# Patient Record
Sex: Female | Born: 1970 | Race: Black or African American | Hispanic: No | Marital: Single | Smoking: Never smoker
Health system: Southern US, Community
[De-identification: ages and names within clinical notes are randomized; demographics above are authoritative.]

## PROBLEM LIST (undated history)

## (undated) DIAGNOSIS — N390 Urinary tract infection, site not specified: Secondary | ICD-10-CM

## (undated) DIAGNOSIS — A048 Other specified bacterial intestinal infections: Secondary | ICD-10-CM

## (undated) DIAGNOSIS — R768 Other specified abnormal immunological findings in serum: Secondary | ICD-10-CM

## (undated) DIAGNOSIS — I469 Cardiac arrest, cause unspecified: Secondary | ICD-10-CM

## (undated) DIAGNOSIS — R7309 Other abnormal glucose: Secondary | ICD-10-CM

## (undated) DIAGNOSIS — I10 Essential (primary) hypertension: Secondary | ICD-10-CM

## (undated) HISTORY — DX: Other specified bacterial intestinal infections: A04.8

## (undated) HISTORY — PX: COLON SURGERY: SHX602

## (undated) HISTORY — PX: TUBAL LIGATION: SHX77

## (undated) HISTORY — DX: Other specified abnormal immunological findings in serum: R76.8

## (undated) HISTORY — PX: ABDOMINAL SURGERY: SHX537

## (undated) HISTORY — DX: Essential (primary) hypertension: I10

## (undated) HISTORY — PX: ESOPHAGOGASTRODUODENOSCOPY: SHX1529

## (undated) HISTORY — PX: COLPOSCOPY: SHX161

## (undated) HISTORY — PX: DILATION AND CURETTAGE OF UTERUS: SHX78

## (undated) HISTORY — DX: Urinary tract infection, site not specified: N39.0

## (undated) HISTORY — DX: Other abnormal glucose: R73.09

---

## 2014-04-19 ENCOUNTER — Encounter (HOSPITAL_COMMUNITY): Payer: Self-pay | Admitting: Emergency Medicine

## 2014-04-19 ENCOUNTER — Emergency Department (HOSPITAL_COMMUNITY)
Admission: EM | Admit: 2014-04-19 | Discharge: 2014-04-19 | Disposition: A | Discharge status: Home / Self Care | Payer: Medicaid - Out of State | Attending: Emergency Medicine | Admitting: Emergency Medicine

## 2014-04-19 ENCOUNTER — Emergency Department (HOSPITAL_COMMUNITY): Payer: Medicaid - Out of State

## 2014-04-19 DIAGNOSIS — Z8674 Personal history of sudden cardiac arrest: Secondary | ICD-10-CM | POA: Diagnosis not present

## 2014-04-19 DIAGNOSIS — R079 Chest pain, unspecified: Secondary | ICD-10-CM

## 2014-04-19 DIAGNOSIS — R51 Headache: Secondary | ICD-10-CM | POA: Diagnosis not present

## 2014-04-19 HISTORY — DX: Cardiac arrest, cause unspecified: I46.9

## 2014-04-19 LAB — CBC
HCT: 39.9 % (ref 36.0–46.0)
Hemoglobin: 13 g/dL (ref 12.0–15.0)
MCH: 26.9 pg (ref 26.0–34.0)
MCHC: 32.6 g/dL (ref 30.0–36.0)
MCV: 82.6 fL (ref 78.0–100.0)
Platelets: 247 10*3/uL (ref 150–400)
RBC: 4.83 MIL/uL (ref 3.87–5.11)
RDW: 14.6 % (ref 11.5–15.5)
WBC: 4.1 10*3/uL (ref 4.0–10.5)

## 2014-04-19 LAB — BASIC METABOLIC PANEL
Anion gap: 13 (ref 5–15)
BUN: 10 mg/dL (ref 6–23)
CALCIUM: 8.6 mg/dL (ref 8.4–10.5)
CO2: 23 mEq/L (ref 19–32)
Chloride: 104 mEq/L (ref 96–112)
Creatinine, Ser: 0.66 mg/dL (ref 0.50–1.10)
Glucose, Bld: 112 mg/dL — ABNORMAL HIGH (ref 70–99)
POTASSIUM: 3.7 meq/L (ref 3.7–5.3)
SODIUM: 140 meq/L (ref 137–147)

## 2014-04-19 LAB — I-STAT TROPONIN, ED: TROPONIN I, POC: 0 ng/mL (ref 0.00–0.08)

## 2014-04-19 MED ORDER — ASPIRIN 325 MG PO TABS
325.0000 mg | ORAL_TABLET | ORAL | Status: AC
  Administered 2014-04-19: 325 mg via ORAL
  Filled 2014-04-19: qty 1

## 2014-04-19 MED ORDER — IBUPROFEN 200 MG PO TABS
600.0000 mg | ORAL_TABLET | Freq: Once | ORAL | Status: AC
  Administered 2014-04-19: 600 mg via ORAL
  Filled 2014-04-19: qty 3
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
## 2014-04-19 NOTE — ED Notes (Signed)
Pt c/o L sided CP radiating at times to R chest and L temple. Pt states pain in chest "feels like a brain freeze from a slushy in my chest" +nausea at times.

## 2014-04-19 NOTE — Discharge Instructions (Signed)
Take motrin or aleve as need for pain. Follow up with primary care doctor, and cardiologist, in the next 1-2 weeks - see referral. Return to ER if worse, new symptoms, trouble breathing, severe headache, persistent/recurrent chest pain, fevers, other concern.     Chest Pain (Nonspecific) It is often hard to give a specific diagnosis for the cause of chest pain. There is always a chance that your pain could be related to something serious, such as a heart attack or a blood clot in the lungs. You need to follow up with your health care provider for further evaluation. CAUSES   Heartburn.  Pneumonia or bronchitis.  Anxiety or stress.  Inflammation around your heart (pericarditis) or lung (pleuritis or pleurisy).  A blood clot in the lung.  A collapsed lung (pneumothorax). It can develop suddenly on its own (spontaneous pneumothorax) or from trauma to the chest.  Shingles infection (herpes zoster virus). The chest wall is composed of bones, muscles, and cartilage. Any of these can be the source of the pain.  The bones can be bruised by injury.  The muscles or cartilage can be strained by coughing or overwork.  The cartilage can be affected by inflammation and become sore (costochondritis). DIAGNOSIS  Lab tests or other studies may be needed to find the cause of your pain. Your health care provider may have you take a test called an ambulatory electrocardiogram (ECG). An ECG records your heartbeat patterns over a 24-hour period. You may also have other tests, such as:  Transthoracic echocardiogram (TTE). During echocardiography, sound waves are used to evaluate how blood flows through your heart.  Transesophageal echocardiogram (TEE).  Cardiac monitoring. This allows your health care provider to monitor your heart rate and rhythm in real time.  Holter monitor. This is a portable device that records your heartbeat and can help diagnose heart arrhythmias. It allows your health care  provider to track your heart activity for several days, if needed.  Stress tests by exercise or by giving medicine that makes the heart beat faster. TREATMENT   Treatment depends on what may be causing your chest pain. Treatment may include:  Acid blockers for heartburn.  Anti-inflammatory medicine.  Pain medicine for inflammatory conditions.  Antibiotics if an infection is present.  You may be advised to change lifestyle habits. This includes stopping smoking and avoiding alcohol, caffeine, and chocolate.  You may be advised to keep your head raised (elevated) when sleeping. This reduces the chance of acid going backward from your stomach into your esophagus. Most of the time, nonspecific chest pain will improve within 2-3 days with rest and mild pain medicine.  HOME CARE INSTRUCTIONS   If antibiotics were prescribed, take them as directed. Finish them even if you start to feel better.  For the next few days, avoid physical activities that bring on chest pain. Continue physical activities as directed.  Do not use any tobacco products, including cigarettes, chewing tobacco, or electronic cigarettes.  Avoid drinking alcohol.  Only take medicine as directed by your health care provider.  Follow your health care provider's suggestions for further testing if your chest pain does not go away.  Keep any follow-up appointments you made. If you do not go to an appointment, you could develop lasting (chronic) problems with pain. If there is any problem keeping an appointment, call to reschedule. SEEK MEDICAL CARE IF:   Your chest pain does not go away, even after treatment.  You have a rash with blisters on your  chest.  You have a fever. SEEK IMMEDIATE MEDICAL CARE IF:   You have increased chest pain or pain that spreads to your arm, neck, jaw, back, or abdomen.  You have shortness of breath.  You have an increasing cough, or you cough up blood.  You have severe back or  abdominal pain.  You feel nauseous or vomit.  You have severe weakness.  You faint.  You have chills. This is an emergency. Do not wait to see if the pain will go away. Get medical help at once. Call your local emergency services (911 in [place] Do not drive yourself to the hospital. MAKE SURE YOU:   Understand these instructions.  Will watch your condition.  Will get help right away if you are not doing well or get worse. Document Released: 07/05/2005 Document Revised: 09/30/2013 Document Reviewed: 04/30/2008 [facility] Patient Information 2015 [place], [place]. This information is not intended to replace advice given to you by your health care provider. Make sure you discuss any questions you have with your health care provider.      Chest Wall Pain Chest wall pain is pain in or around the bones and muscles of your chest. It may take up to 6 weeks to get better. It may take longer if you must stay physically active in your work and activities.  CAUSES  Chest wall pain may happen on its own. However, it may be caused by:  A viral illness like the flu.  Injury.  Coughing.  Exercise.  Arthritis.  Fibromyalgia.  Shingles. HOME CARE INSTRUCTIONS   Avoid overtiring physical activity. Try not to strain or perform activities that cause pain. This includes any activities using your chest or your abdominal and side muscles, especially if heavy weights are used.  Put ice on the sore area.  Put ice in a plastic bag.  Place a towel between your skin and the bag.  Leave the ice on for 15-20 minutes per hour while awake for the first 2 days.  Only take over-the-counter or prescription medicines for pain, discomfort, or fever as directed by your caregiver. SEEK IMMEDIATE MEDICAL CARE IF:   Your pain increases, or you are very uncomfortable.  You have a fever.  Your chest pain becomes worse.  You have new, unexplained symptoms.  You have nausea or vomiting.  You feel  sweaty or lightheaded.  You have a cough with phlegm (sputum), or you cough up blood. MAKE SURE YOU:   Understand these instructions.  Will watch your condition.  Will get help right away if you are not doing well or get worse. Document Released: 09/25/2005 Document Revised: 12/18/2011 Document Reviewed: 05/22/2011 [facility] Patient Information 2015 [place], [place]. This information is not intended to replace advice given to you by your health care provider. Make sure you discuss any questions you have with your health care provider.    General Headache Without Cause A headache is pain or discomfort felt around the head or neck area. The specific cause of a headache may not be found. There are many causes and types of headaches. A few common ones are:  Tension headaches.  Migraine headaches.  Cluster headaches.  Chronic daily headaches. HOME CARE INSTRUCTIONS   Keep all follow-up appointments with your caregiver or any specialist referral.  Only take over-the-counter or prescription medicines for pain or discomfort as directed by your caregiver.  Lie down in a dark, quiet room when you have a headache.  Keep a headache journal to find out  what may trigger your migraine headaches. For example, write down:  What you eat and drink.  How much sleep you get.  Any change to your diet or medicines.  Try massage or other relaxation techniques.  Put ice packs or heat on the head and neck. Use these 3 to 4 times per day for 15 to 20 minutes each time, or as needed.  Limit stress.  Sit up straight, and do not tense your muscles.  Quit smoking if you smoke.  Limit alcohol use.  Decrease the amount of caffeine you drink, or stop drinking caffeine.  Eat and sleep on a regular schedule.  Get 7 to 9 hours of sleep, or as recommended by your caregiver.  Keep lights dim if bright lights bother you and make your headaches worse. SEEK MEDICAL CARE IF:   You have problems with  the medicines you were prescribed.  Your medicines are not working.  You have a change from the usual headache.  You have nausea or vomiting. SEEK IMMEDIATE MEDICAL CARE IF:   Your headache becomes severe.  You have a fever.  You have a stiff neck.  You have loss of vision.  You have muscular weakness or loss of muscle control.  You start losing your balance or have trouble walking.  You feel faint or pass out.  You have severe symptoms that are different from your first symptoms. MAKE SURE YOU:   Understand these instructions.  Will watch your condition.  Will get help right away if you are not doing well or get worse. Document Released: 09/25/2005 Document Revised: 12/18/2011 Document Reviewed: 10/11/2011 [facility] Patient Information 2015 [place], [place]. This information is not intended to replace advice given to you by your health care provider. Make sure you discuss any questions you have with your health care provider.

## 2014-10-18 ENCOUNTER — Emergency Department (INDEPENDENT_AMBULATORY_CARE_PROVIDER_SITE_OTHER)
Admission: EM | Admit: 2014-10-18 | Discharge: 2014-10-18 | Disposition: A | Discharge status: Home / Self Care | Payer: PRIVATE HEALTH INSURANCE | Source: Home / Self Care | Attending: Emergency Medicine | Admitting: Emergency Medicine

## 2014-10-18 ENCOUNTER — Encounter (HOSPITAL_COMMUNITY): Payer: Self-pay

## 2014-10-18 DIAGNOSIS — R079 Chest pain, unspecified: Secondary | ICD-10-CM | POA: Diagnosis not present

## 2014-10-18 DIAGNOSIS — J40 Bronchitis, not specified as acute or chronic: Secondary | ICD-10-CM

## 2014-10-18 MED ORDER — IBUPROFEN 800 MG PO TABS: 800.0000 mg | ORAL_TABLET | Freq: Three times a day (TID) | ORAL | Status: DC | PRN

## 2014-10-18 MED ORDER — AMOXICILLIN 500 MG PO CAPS: 500.0000 mg | ORAL_CAPSULE | Freq: Three times a day (TID) | ORAL | Status: DC

## 2014-10-18 MED ORDER — PREDNISONE 20 MG PO TABS: 40.0000 mg | ORAL_TABLET | Freq: Every day | ORAL | Status: DC
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
## 2014-10-18 NOTE — Discharge Instructions (Signed)
You have bronchitis. Take ibuprofen 800mg  3 times a day for the next week to get rid of that chest soreness. Take prednisone 2 pills daily for 5 days. Take amoxicillin 1 pill 3 times a day for 7 days.  Follow up as needed.

## 2014-10-18 NOTE — ED Notes (Signed)
Patient complains of having cough and congestion and headache that started about a week ago Patient also complains of having left side chest pain not related to cough And discomfort to her left arm

## 2015-03-10 ENCOUNTER — Encounter (HOSPITAL_COMMUNITY): Payer: Self-pay

## 2015-03-10 ENCOUNTER — Emergency Department (HOSPITAL_COMMUNITY): Payer: 59

## 2015-03-10 ENCOUNTER — Emergency Department (HOSPITAL_COMMUNITY)
Admission: EM | Admit: 2015-03-10 | Discharge: 2015-03-11 | Disposition: A | Discharge status: Home / Self Care | Payer: 59 | Attending: Emergency Medicine | Admitting: Emergency Medicine

## 2015-03-10 DIAGNOSIS — Z7952 Long term (current) use of systemic steroids: Secondary | ICD-10-CM | POA: Insufficient documentation

## 2015-03-10 DIAGNOSIS — Z8674 Personal history of sudden cardiac arrest: Secondary | ICD-10-CM | POA: Diagnosis not present

## 2015-03-10 DIAGNOSIS — Y998 Other external cause status: Secondary | ICD-10-CM | POA: Diagnosis not present

## 2015-03-10 DIAGNOSIS — Z792 Long term (current) use of antibiotics: Secondary | ICD-10-CM | POA: Diagnosis not present

## 2015-03-10 DIAGNOSIS — Y9389 Activity, other specified: Secondary | ICD-10-CM | POA: Diagnosis not present

## 2015-03-10 DIAGNOSIS — Y288XXA Contact with other sharp object, undetermined intent, initial encounter: Secondary | ICD-10-CM | POA: Diagnosis not present

## 2015-03-10 DIAGNOSIS — S31030A Puncture wound without foreign body of lower back and pelvis without penetration into retroperitoneum, initial encounter: Secondary | ICD-10-CM | POA: Insufficient documentation

## 2015-03-10 DIAGNOSIS — Y929 Unspecified place or not applicable: Secondary | ICD-10-CM | POA: Insufficient documentation

## 2015-03-10 DIAGNOSIS — T148XXA Other injury of unspecified body region, initial encounter: Secondary | ICD-10-CM

## 2015-03-10 DIAGNOSIS — Z23 Encounter for immunization: Secondary | ICD-10-CM | POA: Diagnosis not present

## 2015-03-10 MED ORDER — TETANUS-DIPHTH-ACELL PERTUSSIS 5-2.5-18.5 LF-MCG/0.5 IM SUSP
0.5000 mL | Freq: Once | INTRAMUSCULAR | Status: AC
  Administered 2015-03-10: 0.5 mL via INTRAMUSCULAR
  Filled 2015-03-10: qty 0.5

## 2015-03-10 MED ORDER — TRAMADOL HCL 50 MG PO TABS
50.0000 mg | ORAL_TABLET | Freq: Once | ORAL | Status: AC
  Administered 2015-03-10: 50 mg via ORAL
  Filled 2015-03-10: qty 1
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
## 2015-03-10 NOTE — ED Notes (Signed)
Patient transported to X-ray 
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
## 2015-03-11 MED ORDER — SULFAMETHOXAZOLE-TRIMETHOPRIM 800-160 MG PO TABS: 1.0000 | ORAL_TABLET | Freq: Two times a day (BID) | ORAL | Status: AC

## 2015-03-11 MED ORDER — TRAMADOL HCL 50 MG PO TABS: 50.0000 mg | ORAL_TABLET | Freq: Four times a day (QID) | ORAL | Status: DC | PRN

## 2015-03-11 NOTE — Discharge Instructions (Signed)
Please follow up with your primary care physician in 1-2 days. If you do not have one please call the [facility] number listed above. Please take your antibiotic until completion. Please read all discharge instructions and return precautions.   Puncture Wound A puncture wound is an injury that extends through all layers of the skin and into the tissue beneath the skin (subcutaneous tissue). Puncture wounds become infected easily because germs often enter the body and go beneath the skin during the injury. Having a deep wound with a small entrance point makes it difficult for your caregiver to adequately clean the wound. This is especially true if you have stepped on a nail and it has passed through a dirty shoe or other situations where the wound is obviously contaminated. CAUSES  Many puncture wounds involve glass, nails, splinters, fish hooks, or other objects that enter the skin (foreign bodies). A puncture wound may also be caused by a human bite or animal bite. DIAGNOSIS  A puncture wound is usually diagnosed by your history and a physical exam. You may need to have an X-ray or an ultrasound to check for any foreign bodies still in the wound. TREATMENT   Your caregiver will clean the wound as thoroughly as possible. Depending on the location of the wound, a bandage (dressing) may be applied.  Your caregiver might prescribe antibiotic medicines.  You may need a follow-up visit to check on your wound. Follow all instructions as directed by your caregiver. HOME CARE INSTRUCTIONS   Change your dressing once per day, or as directed by your caregiver. If the dressing sticks, it may be removed by soaking the area in water.  If your caregiver has given you follow-up instructions, it is very important that you return for a follow-up appointment. Not following up as directed could result in a chronic or permanent injury, pain, and disability.  Only take over-the-counter or  prescription medicines for pain, discomfort, or fever as directed by your caregiver.  If you are given antibiotics, take them as directed. Finish them even if you start to feel better. You may need a tetanus shot if:  You cannot remember when you had your last tetanus shot.  You have never had a tetanus shot. If you got a tetanus shot, your arm may swell, get red, and feel warm to the touch. This is common and not a problem. If you need a tetanus shot and you choose not to have one, there is a rare chance of getting tetanus. Sickness from tetanus can be serious. You may need a rabies shot if an animal bite caused your puncture wound. SEEK MEDICAL CARE IF:   You have redness, swelling, or increasing pain in the wound.  You have red streaks going away from the wound.  You notice a bad smell coming from the wound or dressing.  You have yellowish-white fluid (pus) coming from the wound.  You are treated with an antibiotic for infection, but the infection is not getting better.  You notice something in the wound, such as rubber from your shoe, cloth, or another object.  You have a fever.  You have severe pain.  You have difficulty breathing.  You feel dizzy or faint.  You cannot stop vomiting.  You lose feeling, develop numbness, or cannot move a limb below the wound.  Your symptoms worsen. MAKE SURE YOU:  Understand these instructions.  Will watch your condition.  Will get help right away if you are  not doing well or get worse. Document Released: 07/05/2005 Document Revised: 12/18/2011 Document Reviewed: 03/14/2011 [facility] Patient Information 2015 [place], [place]. This information is not intended to replace advice given to you by your health care provider. Make sure you discuss any questions you have with your health care provider.

## 2015-03-30 ENCOUNTER — Encounter (HOSPITAL_COMMUNITY): Payer: Self-pay | Admitting: Emergency Medicine

## 2015-03-30 DIAGNOSIS — R1013 Epigastric pain: Secondary | ICD-10-CM | POA: Diagnosis not present

## 2015-03-30 DIAGNOSIS — R112 Nausea with vomiting, unspecified: Secondary | ICD-10-CM | POA: Insufficient documentation

## 2015-03-30 DIAGNOSIS — Z79899 Other long term (current) drug therapy: Secondary | ICD-10-CM | POA: Diagnosis not present

## 2015-03-30 DIAGNOSIS — Z792 Long term (current) use of antibiotics: Secondary | ICD-10-CM | POA: Diagnosis not present

## 2015-03-30 DIAGNOSIS — Z8674 Personal history of sudden cardiac arrest: Secondary | ICD-10-CM | POA: Insufficient documentation

## 2015-03-30 DIAGNOSIS — Z3202 Encounter for pregnancy test, result negative: Secondary | ICD-10-CM | POA: Diagnosis not present

## 2015-03-30 DIAGNOSIS — R197 Diarrhea, unspecified: Secondary | ICD-10-CM | POA: Insufficient documentation

## 2015-03-30 DIAGNOSIS — R109 Unspecified abdominal pain: Secondary | ICD-10-CM | POA: Diagnosis present

## 2015-03-31 ENCOUNTER — Emergency Department (HOSPITAL_COMMUNITY)
Admission: EM | Admit: 2015-03-31 | Discharge: 2015-03-31 | Disposition: A | Discharge status: Home / Self Care | Payer: 59 | Attending: Emergency Medicine | Admitting: Emergency Medicine

## 2015-03-31 ENCOUNTER — Emergency Department (HOSPITAL_COMMUNITY): Payer: 59

## 2015-03-31 ENCOUNTER — Encounter (HOSPITAL_COMMUNITY): Payer: Self-pay | Admitting: Radiology

## 2015-03-31 DIAGNOSIS — R1013 Epigastric pain: Secondary | ICD-10-CM

## 2015-03-31 LAB — CBC WITH DIFFERENTIAL/PLATELET
BASOS PCT: 0 % (ref 0–1)
Basophils Absolute: 0 10*3/uL (ref 0.0–0.1)
EOS ABS: 0.1 10*3/uL (ref 0.0–0.7)
Eosinophils Relative: 2 % (ref 0–5)
HEMATOCRIT: 40.5 % (ref 36.0–46.0)
Hemoglobin: 13.5 g/dL (ref 12.0–15.0)
Lymphocytes Relative: 41 % (ref 12–46)
Lymphs Abs: 2.6 10*3/uL (ref 0.7–4.0)
MCH: 26.8 pg (ref 26.0–34.0)
MCHC: 33.3 g/dL (ref 30.0–36.0)
MCV: 80.4 fL (ref 78.0–100.0)
MONO ABS: 0.5 10*3/uL (ref 0.1–1.0)
Monocytes Relative: 7 % (ref 3–12)
NEUTROS ABS: 3.1 10*3/uL (ref 1.7–7.7)
Neutrophils Relative %: 50 % (ref 43–77)
Platelets: 276 10*3/uL (ref 150–400)
RBC: 5.04 MIL/uL (ref 3.87–5.11)
RDW: 14.8 % (ref 11.5–15.5)
WBC: 6.3 10*3/uL (ref 4.0–10.5)

## 2015-03-31 LAB — COMPREHENSIVE METABOLIC PANEL
ALBUMIN: 4.1 g/dL (ref 3.5–5.0)
ALT: 17 U/L (ref 14–54)
AST: 22 U/L (ref 15–41)
Alkaline Phosphatase: 70 U/L (ref 38–126)
Anion gap: 11 (ref 5–15)
BILIRUBIN TOTAL: 0.2 mg/dL — AB (ref 0.3–1.2)
BUN: 11 mg/dL (ref 6–20)
CHLORIDE: 103 mmol/L (ref 101–111)
CO2: 23 mmol/L (ref 22–32)
CREATININE: 0.78 mg/dL (ref 0.44–1.00)
Calcium: 9 mg/dL (ref 8.9–10.3)
GFR calc Af Amer: >60 mL/min (ref >60)
GFR calc non Af Amer: >60 mL/min (ref >60)
Glucose, Bld: 109 mg/dL — ABNORMAL HIGH (ref 65–99)
Potassium: 3.4 mmol/L — ABNORMAL LOW (ref 3.5–5.1)
Sodium: 137 mmol/L (ref 135–145)
Total Protein: 7.8 g/dL (ref 6.5–8.1)

## 2015-03-31 LAB — URINALYSIS, ROUTINE W REFLEX MICROSCOPIC
Bilirubin Urine: NEGATIVE
Glucose, UA: 100 mg/dL — AB
Ketones, ur: NEGATIVE mg/dL
LEUKOCYTES UA: NEGATIVE
NITRITE: NEGATIVE
Protein, ur: NEGATIVE mg/dL
Specific Gravity, Urine: 1.025 (ref 1.005–1.030)
UROBILINOGEN UA: 0.2 mg/dL (ref 0.0–1.0)
pH: 6 (ref 5.0–8.0)

## 2015-03-31 LAB — URINE MICROSCOPIC-ADD ON

## 2015-03-31 LAB — LIPASE, BLOOD: Lipase: 30 U/L (ref 22–51)

## 2015-03-31 LAB — POC URINE PREG, ED: Preg Test, Ur: NEGATIVE

## 2015-03-31 MED ORDER — DICYCLOMINE HCL 10 MG/ML IM SOLN
20.0000 mg | Freq: Once | INTRAMUSCULAR | Status: AC
  Administered 2015-03-31: 20 mg via INTRAMUSCULAR
  Filled 2015-03-31: qty 2

## 2015-03-31 MED ORDER — ONDANSETRON HCL 4 MG/2ML IJ SOLN
4.0000 mg | Freq: Once | INTRAMUSCULAR | Status: AC
  Administered 2015-03-31: 4 mg via INTRAVENOUS
  Filled 2015-03-31: qty 2

## 2015-03-31 MED ORDER — PANTOPRAZOLE SODIUM 40 MG IV SOLR
40.0000 mg | Freq: Once | INTRAVENOUS | Status: AC
  Administered 2015-03-31: 40 mg via INTRAVENOUS
  Filled 2015-03-31: qty 40

## 2015-03-31 MED ORDER — SUCRALFATE 1 G PO TABS
1.0000 g | ORAL_TABLET | Freq: Once | ORAL | Status: AC
  Administered 2015-03-31: 1 g via ORAL
  Filled 2015-03-31: qty 1

## 2015-03-31 MED ORDER — METOCLOPRAMIDE HCL 10 MG PO TABS: 10.0000 mg | ORAL_TABLET | Freq: Three times a day (TID) | ORAL | Status: DC

## 2015-03-31 MED ORDER — DICYCLOMINE HCL 20 MG PO TABS: 20.0000 mg | ORAL_TABLET | Freq: Four times a day (QID) | ORAL | Status: DC | PRN

## 2015-03-31 MED ORDER — KETOROLAC TROMETHAMINE 30 MG/ML IJ SOLN
30.0000 mg | Freq: Once | INTRAMUSCULAR | Status: AC
  Administered 2015-03-31: 30 mg via INTRAVENOUS
  Filled 2015-03-31: qty 1

## 2015-03-31 MED ORDER — IOHEXOL 300 MG/ML  SOLN
100.0000 mL | Freq: Once | INTRAMUSCULAR | Status: AC | PRN
  Administered 2015-03-31: 100 mL via INTRAVENOUS

## 2015-03-31 MED ORDER — FENTANYL CITRATE (PF) 100 MCG/2ML IJ SOLN
50.0000 ug | Freq: Once | INTRAMUSCULAR | Status: AC
  Administered 2015-03-31: 50 ug via INTRAVENOUS
  Filled 2015-03-31: qty 2

## 2015-03-31 MED ORDER — FAMOTIDINE 20 MG PO TABS: 20.0000 mg | ORAL_TABLET | Freq: Two times a day (BID) | ORAL | Status: DC

## 2015-03-31 MED ORDER — MORPHINE SULFATE 2 MG/ML IJ SOLN
2.0000 mg | Freq: Once | INTRAMUSCULAR | Status: AC
  Administered 2015-03-31: 2 mg via INTRAVENOUS
  Filled 2015-03-31: qty 1

## 2015-03-31 MED ORDER — SODIUM CHLORIDE 0.9 % IV BOLUS (SEPSIS)
1000.0000 mL | Freq: Once | INTRAVENOUS | Status: AC
  Administered 2015-03-31: 1000 mL via INTRAVENOUS

## 2015-03-31 NOTE — Discharge Instructions (Signed)
Your workup today has not shown a specific cause for your symptoms.  No signs of obstruction, inflammation or infection were noted.  Follow-up with her primary care doctor.  If symptoms continue, you may need follow-up with a gastroenterologist.  Take medications as prescribed.  Return to the emergency department for worsening condition or new concerning symptoms.   Abdominal Pain Many things can cause abdominal pain. Usually, abdominal pain is not caused by a disease and will improve without treatment. It can often be observed and treated at home. Your health care provider will do a physical exam and possibly order blood tests and X-rays to help determine the seriousness of your pain. However, in many cases, more time must pass before a clear cause of the pain can be found. Before that point, your health care provider may not know if you need more testing or further treatment. HOME CARE INSTRUCTIONS  Monitor your abdominal pain for any changes. The following actions may help to alleviate any discomfort you are experiencing:  Only take over-the-counter or prescription medicines as directed by your health care provider.  Do not take laxatives unless directed to do so by your health care provider.  Try a clear liquid diet (broth, tea, or water) as directed by your health care provider. Slowly move to a bland diet as tolerated. SEEK MEDICAL CARE IF:  You have unexplained abdominal pain.  You have abdominal pain associated with nausea or diarrhea.  You have pain when you urinate or have a bowel movement.  You experience abdominal pain that wakes you in the night.  You have abdominal pain that is worsened or improved by eating food.  You have abdominal pain that is worsened with eating fatty foods.  You have a fever. SEEK IMMEDIATE MEDICAL CARE IF:   Your pain does not go away within 2 hours.  You keep throwing up (vomiting).  Your pain is felt only in portions of the abdomen, such as  the right side or the left lower portion of the abdomen.  You pass bloody or black tarry stools. MAKE SURE YOU:  Understand these instructions.   Will watch your condition.   Will get help right away if you are not doing well or get worse.  Document Released: 07/05/2005 Document Revised: 09/30/2013 Document Reviewed: 06/04/2013 [facility] Patient Information 2015 [place], [place]. This information is not intended to replace advice given to you by your health care provider. Make sure you discuss any questions you have with your health care provider.  Pain of Unknown Etiology (Pain Without a Known Cause) You have come to your caregiver because of pain. Pain can occur in any part of the body. Often there is not a definite cause. If your laboratory (blood or urine) work was normal and X-rays or other studies were normal, your caregiver may treat you without knowing the cause of the pain. An example of this is the headache. Most headaches are diagnosed by taking a history. This means your caregiver asks you questions about your headaches. Your caregiver determines a treatment based on your answers. Usually testing done for headaches is normal. Often testing is not done unless there is no response to medications. Regardless of where your pain is located today, you can be given medications to make you comfortable. If no physical cause of pain can be found, most cases of pain will gradually leave as suddenly as they came.  If you have a painful condition and no reason can be found for the pain,  it is important that you follow up with your caregiver. If the pain becomes worse or does not go away, it may be necessary to repeat tests and look further for a possible cause.  Only take over-the-counter or prescription medicines for pain, discomfort, or fever as directed by your caregiver.  For the protection of your privacy, test results cannot be given over the phone. Make sure you receive the results of your  test. Ask how these results are to be obtained if you have not been informed. It is your responsibility to obtain your test results.  You may continue all activities unless the activities cause more pain. When the pain lessens, it is important to gradually resume normal activities. Resume activities by beginning slowly and gradually increasing the intensity and duration of the activities or exercise. During periods of severe pain, bed rest may be helpful. Lie or sit in any position that is comfortable.  Ice used for acute (sudden) conditions may be effective. Use a large plastic bag filled with ice and wrapped in a towel. This may provide pain relief.  See your caregiver for continued problems. Your caregiver can help or refer you for exercises or physical therapy if necessary. If you were given medications for your condition, do not drive, operate machinery or power tools, or sign legal documents for 24 hours. Do not drink alcohol, take sleeping pills, or take other medications that may interfere with treatment. See your caregiver immediately if you have pain that is becoming worse and not relieved by medications. Document Released: 06/20/2001 Document Revised: 07/16/2013 Document Reviewed: 09/25/2005 [facility] Patient Information 2015 [place], [place]. This information is not intended to replace advice given to you by your health care provider. Make sure you discuss any questions you have with your health care provider.

## 2015-03-31 NOTE — ED Notes (Signed)
Pt. reports generalized abdominal pain with nausea and diarrhea onset this week , denies emesis . No fever or chills.
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
## 2015-08-11 ENCOUNTER — Emergency Department (HOSPITAL_COMMUNITY)
Admission: EM | Admit: 2015-08-11 | Discharge: 2015-08-12 | Disposition: A | Discharge status: Home / Self Care | Payer: Commercial Managed Care - HMO | Attending: Emergency Medicine | Admitting: Emergency Medicine

## 2015-08-11 ENCOUNTER — Encounter (HOSPITAL_COMMUNITY): Payer: Self-pay | Admitting: Emergency Medicine

## 2015-08-11 DIAGNOSIS — Z3202 Encounter for pregnancy test, result negative: Secondary | ICD-10-CM | POA: Insufficient documentation

## 2015-08-11 DIAGNOSIS — R110 Nausea: Secondary | ICD-10-CM | POA: Insufficient documentation

## 2015-08-11 DIAGNOSIS — R1013 Epigastric pain: Secondary | ICD-10-CM | POA: Insufficient documentation

## 2015-08-11 DIAGNOSIS — R1010 Upper abdominal pain, unspecified: Secondary | ICD-10-CM

## 2015-08-11 DIAGNOSIS — Z9851 Tubal ligation status: Secondary | ICD-10-CM | POA: Diagnosis not present

## 2015-08-11 DIAGNOSIS — Z79899 Other long term (current) drug therapy: Secondary | ICD-10-CM | POA: Insufficient documentation

## 2015-08-11 DIAGNOSIS — Z8674 Personal history of sudden cardiac arrest: Secondary | ICD-10-CM | POA: Insufficient documentation

## 2015-08-11 DIAGNOSIS — R1011 Right upper quadrant pain: Secondary | ICD-10-CM | POA: Diagnosis not present

## 2015-08-11 LAB — COMPREHENSIVE METABOLIC PANEL
ALK PHOS: 65 U/L (ref 38–126)
ALT: 18 U/L (ref 14–54)
AST: 20 U/L (ref 15–41)
Albumin: 3.8 g/dL (ref 3.5–5.0)
Anion gap: 7 (ref 5–15)
BUN: 9 mg/dL (ref 6–20)
CALCIUM: 8.9 mg/dL (ref 8.9–10.3)
CHLORIDE: 107 mmol/L (ref 101–111)
CO2: 23 mmol/L (ref 22–32)
CREATININE: 0.64 mg/dL (ref 0.44–1.00)
GFR calc Af Amer: >60 mL/min (ref >60)
Glucose, Bld: 96 mg/dL (ref 65–99)
Potassium: 3.5 mmol/L (ref 3.5–5.1)
Sodium: 137 mmol/L (ref 135–145)
Total Bilirubin: 0.4 mg/dL (ref 0.3–1.2)
Total Protein: 7.3 g/dL (ref 6.5–8.1)

## 2015-08-11 LAB — I-STAT BETA HCG BLOOD, ED (MC, WL, AP ONLY)

## 2015-08-11 LAB — CBC
HCT: 38.3 % (ref 36.0–46.0)
Hemoglobin: 12.5 g/dL (ref 12.0–15.0)
MCH: 25.6 pg — ABNORMAL LOW (ref 26.0–34.0)
MCHC: 32.6 g/dL (ref 30.0–36.0)
MCV: 78.5 fL (ref 78.0–100.0)
Platelets: 279 10*3/uL (ref 150–400)
RBC: 4.88 MIL/uL (ref 3.87–5.11)
RDW: 15.2 % (ref 11.5–15.5)
WBC: 6.3 10*3/uL (ref 4.0–10.5)

## 2015-08-11 LAB — LIPASE, BLOOD: Lipase: 30 U/L (ref 11–51)

## 2015-08-11 NOTE — ED Notes (Signed)
Pt states that she has had mid upper abdominal pain x 3 days. Nausea. Denies vomiting or diarrhea. Skipped her period last month but has had a tubal ligation. Alert and oriented.

## 2015-08-11 NOTE — ED Notes (Signed)
Pt states her abdomen has been hurting x 3 days. Pt is able to tolerate foods and liquids. Pt states she had chili cheese fries and pasta for dinner last night; she states eating sometimes makes her stomach pain. She states she hurts at the top and the bottom of her stomach. She states the pain is "cramping and sharp". Pt states she has a history of acid reflux and she has had exploratory surgery after a tubal ligation. Pt states she had a small bowel movement this morning and she is currently able to pass gas.
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
## 2015-08-12 ENCOUNTER — Emergency Department (HOSPITAL_COMMUNITY): Payer: Commercial Managed Care - HMO

## 2015-08-12 LAB — URINALYSIS, ROUTINE W REFLEX MICROSCOPIC
BILIRUBIN URINE: NEGATIVE
Glucose, UA: 100 mg/dL — AB
Ketones, ur: NEGATIVE mg/dL
Leukocytes, UA: NEGATIVE
Nitrite: NEGATIVE
Protein, ur: NEGATIVE mg/dL
Specific Gravity, Urine: 1.027 (ref 1.005–1.030)
Urobilinogen, UA: 1 mg/dL (ref 0.0–1.0)
pH: 5.5 (ref 5.0–8.0)

## 2015-08-12 LAB — URINE MICROSCOPIC-ADD ON

## 2015-08-12 MED ORDER — DICYCLOMINE HCL 20 MG PO TABS: 20.0000 mg | ORAL_TABLET | Freq: Four times a day (QID) | ORAL | Status: DC | PRN

## 2015-08-12 MED ORDER — FAMOTIDINE 20 MG PO TABS: 20.0000 mg | ORAL_TABLET | Freq: Two times a day (BID) | ORAL | Status: DC

## 2015-08-12 MED ORDER — GI COCKTAIL ~~LOC~~
30.0000 mL | Freq: Once | ORAL | Status: AC
  Administered 2015-08-12: 30 mL via ORAL
  Filled 2015-08-12: qty 30

## 2015-08-12 NOTE — ED Notes (Signed)
US at bedside

## 2015-08-12 NOTE — Discharge Instructions (Signed)
Abdominal Pain, Adult °Many things can cause abdominal pain. Usually, abdominal pain is not caused by a disease and will improve without treatment. It can often be observed and treated at home. Your health care provider will do a physical exam and possibly order blood tests and X-rays to help determine the seriousness of your pain. However, in many cases, more time must pass before a clear cause of the pain can be found. Before that point, your health care provider may not know if you need more testing or further treatment. °HOME CARE INSTRUCTIONS °Monitor your abdominal pain for any changes. The following actions may help to alleviate any discomfort you are experiencing: °· Only take over-the-counter or prescription medicines as directed by your health care provider. °· Do not take laxatives unless directed to do so by your health care provider. °· Try a clear liquid diet (broth, tea, or water) as directed by your health care provider. Slowly move to a bland diet as tolerated. °SEEK MEDICAL CARE IF: °· You have unexplained abdominal pain. °· You have abdominal pain associated with nausea or diarrhea. °· You have pain when you urinate or have a bowel movement. °· You experience abdominal pain that wakes you in the night. °· You have abdominal pain that is worsened or improved by eating food. °· You have abdominal pain that is worsened with eating fatty foods. °· You have a fever. °SEEK IMMEDIATE MEDICAL CARE IF: °· Your pain does not go away within 2 hours. °· You keep throwing up (vomiting). °· Your pain is felt only in portions of the abdomen, such as the right side or the left lower portion of the abdomen. °· You pass bloody or black tarry stools. °MAKE SURE YOU: °· Understand these instructions. °· Will watch your condition. °· Will get help right away if you are not doing well or get worse. °  °This information is not intended to replace advice given to you by your health care provider. Make sure you discuss  any questions you have with your health care provider. °  °Document Released: 07/05/2005 Document Revised: 06/16/2015 Document Reviewed: 06/04/2013 °Elsevier Interactive Patient Education ©2016 Elsevier Inc. ° ° °Emergency Department Resource Guide °1) Find a Doctor and Pay Out of Pocket °Although you won't have to find out who is covered by your insurance plan, it is a good idea to ask around and get recommendations. You will then need to call the office and see if the doctor you have chosen will accept you as a new patient and what types of options they offer for patients who are self-pay. Some doctors offer discounts or will set up payment plans for their patients who do not have insurance, but you will need to ask so you aren't surprised when you get to your appointment. ° °2) Contact Your Local Health Department °Not all health departments have doctors that can see patients for sick visits, but many do, so it is worth a call to see if yours does. If you don't know where your local health department is, you can check in your phone book. The CDC also has a tool to help you locate your state's health department, and many state websites also have listings of all of their local health departments. ° °3) Find a Walk-in Clinic °If your illness is not likely to be very severe or complicated, you may want to try a walk in clinic. These are popping up all over the country in pharmacies, drugstores, and shopping centers.   They're usually staffed by nurse practitioners or physician assistants that have been trained to treat common illnesses and complaints. They're usually fairly quick and inexpensive. However, if you have serious medical issues or chronic medical problems, these are probably not your best option. ° °No Primary Care Doctor: °- Call [facility] at  832-8000 - they can help you locate a primary care doctor that  accepts your insurance, provides certain services, etc. °- Physician Referral Service-  1-800-533-3463 ° °Chronic Pain Problems: °Organization         Address  Phone   Notes  °[facility]  (336) 297-2271 Patients need to be referred by their primary care doctor.  ° °Medication Assistance: °Organization         Address  Phone   Notes  °[place] [facility] [address] °[place], [place] [address] (336) 641-8030 --Must be a resident of [place] °-- Must have NO insurance coverage whatsoever (no Medicaid/ Medicare, etc.) °-- The pt. MUST have a primary care doctor that directs their care regularly and follows them in the community °  °[facility]  (866) 331-1348   °[employer]  (888) 892-1162   ° °Agencies that provide inexpensive medical care: °Organization         Address  Phone   Notes  °[facility]  (336) 832-8035   °[facility]    (336) 832-7272   °[facility] [address] °[place], [place] [address] (336) 832-4777   °[facility] [address], °[place] (336) 271-4999   °[facility]    (336) 373-0678   °[facility]    (336) 272-1050   °[facility] ° [address], [place] Phone:  (336) 832-4444, Fax:  (336) 832-4440 Hours of Operation:  9 am - 6 pm, M-F.  Also accepts Medicaid/Medicare and self-pay.  °[facility] ° [address], [place] Phone: (336) 832-3150, Fax: (336) 832-3151. Hours of Operation:  8:30 am - 5:30 pm, M-F.  Also accepts Medicaid and self-pay.  °[facility] [place] [address], [place] Phone: (336) 878-6027   °[facility] [address], [place], [place] (336)723-1848, Ext. 123 Mondays & Thursdays: 7-9 AM.  First 15 patients are seen on a first come, first serve basis. °  ° °Medicaid-accepting [place] Providers: ° °Organization         Address  Phone   Notes  °[facility] [address],  [place] (336) 641-2100 Also accepts self-pay patients.  °[facility] [address], [place] ° (336) 856-9996   °[facility] [address], [place] (336) 288-8857   °[facility] [address] [place] [address], [place] (336) 299-7000   °Veita Bland [address], [place]  ° (336) 373-1557 Only accepts [place] Access Medicaid patients after they have their name applied to their card.  ° °Self-Pay (no insurance) in [place]: ° °Organization         Address  Phone   Notes  °Sickle Cell Patients, [facility] [address], [place] (336) 832-1970   °[facility] [address],  (336) 832-4400   °[facility] [place] ° [address], [place] (336) 992-4800   °  [facility]/Dr. Osei-Bonsu ° [address] [place] [address], [place] or [address], [place] (336) [phone] Phone number for both [place] and [place] locations is the same.  °[facility] [address], [place] (336) 299-0000   °[facility] [address] [place] [address], [place] or [address] (336) 852-7530 °(336) 878-2260   °[facility] [address], [place] (336) 350-1642, phone; (336) 294-5005, fax Sees patients 1st and 3rd Saturday of every month.  Must not qualify for public or private insurance (i.e. Medicaid, Medicare, [place] Health Choice, Veterans' Benefits) • Household income should be no more than 200% of the poverty level •The clinic cannot treat you if you are pregnant or think you are pregnant • Sexually transmitted diseases are not treated at the clinic.  ° ° °Dental Care: °Organization         Address  Phone  Notes  °[facility] [address], [place] (336) 641-6152 Accepts children up to age 21 who are enrolled in  Medicaid or [place] Health Choice; pregnant women with a Medicaid card; and children who have applied for Medicaid or [place] Health Choice, but were declined, whose parents can pay a reduced fee at time of service.  °[facility] [place]  [address], [place] (336) 641-7733 Accepts children up to age 21 who are enrolled in Medicaid or [place] Health Choice; pregnant women with a Medicaid card; and children who have applied for Medicaid or [place] Health Choice, but were declined, whose parents can pay a reduced fee at time of service.  °[facility] ° [address], [place] (336) 641-4533 Patients are seen by appointment only. Walk-ins are not accepted. [facility] will see patients 18 years of age and older. °Monday - Tuesday (8am-5pm) °Most Wednesdays (8:30-5pm) °$30 per visit, cash only  °[facility] ° [address], [place] (336) 641-4533 Patients are seen by appointment only. Walk-ins are not accepted. [facility] will see patients 18 years of age and older. °One Wednesday Evening (Monthly: Volunteer Based).  $30 per visit, cash only  °[facility]  (919) 537-3737 for adults; Children under age 4, call Graduate Pediatric Dentistry at (919) [phone]. Children aged 4-14, please call (919) [phone] to request a pediatric application. ° Dental services are provided in all areas of dental care including fillings, crowns and bridges, complete and partial dentures, implants, gum treatment, root canals, and extractions. Preventive care is also provided. Treatment is provided to both adults and children. °Patients are selected via a lottery and there is often a waiting list. °  °[facility] [address], °[place] ° (336) 763-8833 [email or web address] °  °[facility] [address], [place], [place] (336)723-1848, Ext. 123 Second and Fourth Thursday of each month, opens at 6:30  AM; Clinic ends at 9 AM.  Patients are seen on a first-come first-served basis, and a limited number are seen during each clinic.  ° °[facility] ° [address], [place], [place] (336) 723-7904   Eligibility Requirements °You must have lived in [place], [place], or [place] counties for at least the last three months. °  You cannot be eligible for state or federal sponsored healthcare insurance, including Veterans Administration, Medicaid, or Medicare. °  You generally cannot be eligible for healthcare insurance   through your employer.  °  How to apply: °Eligibility screenings are held every Tuesday and Wednesday afternoon from 1:00 pm until 4:00 pm. You do not need an appointment for the interview!  °[facility] [address], [place], [place] 336-631-2330   °[facility]  336-342-8273   °[facility]  336-703-3100   °[facility]  336-570-6415   ° °Behavioral Health Resources in the Community: °Intensive Outpatient Programs °Organization         Address  Phone  Notes  °[facility] [address], [place], [place] 336-878-6098   °[facility] [address], [place], [place] 336-832-9800   °[facility] [address], [place], [place] ° 336-882-2125   °[facility] [address],  °[place], [place] 1-800-853-5163 or 336-641-4981   °Substance Abuse Resources °Organization         Address  Phone  Notes  °[facility]  336-882-2125   °[facility]  336-784-9470   °[facility]  336-285-9073   °[facility]  336-845-3988   °Residential & Outpatient Substance Abuse Program  1-800-659-3381   °Psychological Services °Organization         Address  Phone  Notes  °[facility]  336- 832-9600   °[facility]  336- 378-7881   °[facility] [address], [place] 1-800-853-5163 or  336-641-4981   ° °Mobile Crisis Teams °Organization         Address  Phone  Notes  °[facility], Mobile Crisis Care Unit  1-877-626-1772   °Assertive °[facility] ° [address] [place], [place] 336-834-9664   °Sharon DeEsch [address] °[place] 336-554-5454   ° °Self-Help/Support Groups °Organization         Address  Phone             Notes  °[facility] [place] - variety of support groups  336- 373-1402 Call for more information  °Narcotics Anonymous (NA), [facility] [address], °[place]  2 meetings at this location  ° °Residential Treatment Programs °Organization         Address  Phone  Notes  °[facility] [address],    °[place]  1-866-801-8205   °[facility] ° [address], [place], [place] 704-293-8524   °[facility] [address], [place] 336-845-3988 Admissions: 8am-3pm M-F  °[facility] [address]    °[place], [place] 336-841-1104   °[facility] [address], [place], [place] 336-379-7146   °[facility] [address]  °[place], [place] 336-285-9073   °[facility] - Intensive Outpatient [address], [place], [place] 336-852-3033   °[facility] [address]  °[place], [place] 1-877-615-2722 or 336-784-9470   °[facility] [address] [place], [place] 336-227-7417 Accepts Medicaid  °[facility] [address]  ° [place] 1-800-659-3381 Substance Abuse/Addiction Treatment  ° °[place] Behavioral Health Resources °Organization         Address  Phone  Notes  °[facility]  (888) 581-9988   °Julie Brannon, PhD [address] [place], [place]   (336) 349-5553 or (336) 951-0000   °[facility]   [address] °[place], [place] (336) 349-4454   °  [facility] [address],  [place], [place] (336) 342-8316 Insurance/Medicaid/sponsorship through [facility]  °[facility] [address]                                    [place], [place] (336) 342-8316 Therapy/tele-psych/case  °[facility] [address]  ° [place], [place] (336) 349-2233    °Dr. Arfeen  (336) 349-4544   °[facility] [place]  [employer] [place] [facility] 1) [address], [place] °2) [address], [place] °3)  [address], [place] (336) 349-3220 °(336) 342-7768 ° °(336) 342-8140   °[place] [facility] (336) 342-1394 or (336) 342-3537 (After Hours)    ° ° ° °

## 2015-09-15 ENCOUNTER — Encounter: Payer: 59 | Admitting: Obstetrics & Gynecology

## 2015-09-26 ENCOUNTER — Emergency Department (HOSPITAL_COMMUNITY)
Admission: EM | Admit: 2015-09-26 | Discharge: 2015-09-26 | Disposition: A | Discharge status: Home / Self Care | Payer: Commercial Managed Care - HMO | Attending: Emergency Medicine | Admitting: Emergency Medicine

## 2015-09-26 ENCOUNTER — Encounter (HOSPITAL_COMMUNITY): Payer: Self-pay | Admitting: Emergency Medicine

## 2015-09-26 ENCOUNTER — Emergency Department (HOSPITAL_COMMUNITY): Payer: Commercial Managed Care - HMO

## 2015-09-26 DIAGNOSIS — R519 Headache, unspecified: Secondary | ICD-10-CM

## 2015-09-26 DIAGNOSIS — Z8674 Personal history of sudden cardiac arrest: Secondary | ICD-10-CM | POA: Diagnosis not present

## 2015-09-26 DIAGNOSIS — R079 Chest pain, unspecified: Secondary | ICD-10-CM | POA: Insufficient documentation

## 2015-09-26 DIAGNOSIS — R51 Headache: Secondary | ICD-10-CM | POA: Insufficient documentation

## 2015-09-26 DIAGNOSIS — R0981 Nasal congestion: Secondary | ICD-10-CM | POA: Insufficient documentation

## 2015-09-26 DIAGNOSIS — R05 Cough: Secondary | ICD-10-CM | POA: Diagnosis not present

## 2015-09-26 DIAGNOSIS — Z3202 Encounter for pregnancy test, result negative: Secondary | ICD-10-CM | POA: Diagnosis not present

## 2015-09-26 DIAGNOSIS — R0602 Shortness of breath: Secondary | ICD-10-CM | POA: Diagnosis not present

## 2015-09-26 DIAGNOSIS — R110 Nausea: Secondary | ICD-10-CM | POA: Insufficient documentation

## 2015-09-26 DIAGNOSIS — Z79899 Other long term (current) drug therapy: Secondary | ICD-10-CM | POA: Diagnosis not present

## 2015-09-26 DIAGNOSIS — I10 Essential (primary) hypertension: Secondary | ICD-10-CM | POA: Insufficient documentation

## 2015-09-26 LAB — BASIC METABOLIC PANEL
Anion gap: 10 (ref 5–15)
BUN: 13 mg/dL (ref 6–20)
CO2: 25 mmol/L (ref 22–32)
Calcium: 9.1 mg/dL (ref 8.9–10.3)
Chloride: 104 mmol/L (ref 101–111)
Creatinine, Ser: 0.79 mg/dL (ref 0.44–1.00)
GFR calc Af Amer: >60 mL/min (ref >60)
GFR calc non Af Amer: >60 mL/min (ref >60)
Glucose, Bld: 98 mg/dL (ref 65–99)
Potassium: 3.6 mmol/L (ref 3.5–5.1)
Sodium: 139 mmol/L (ref 135–145)

## 2015-09-26 LAB — I-STAT TROPONIN, ED: Troponin i, poc: 0.01 ng/mL (ref 0.00–0.08)

## 2015-09-26 LAB — POC URINE PREG, ED: PREG TEST UR: NEGATIVE

## 2015-09-26 LAB — CBC
HCT: 39.8 % (ref 36.0–46.0)
Hemoglobin: 12.7 g/dL (ref 12.0–15.0)
MCH: 25.2 pg — ABNORMAL LOW (ref 26.0–34.0)
MCHC: 31.9 g/dL (ref 30.0–36.0)
MCV: 79 fL (ref 78.0–100.0)
PLATELETS: 365 10*3/uL (ref 150–400)
RBC: 5.04 MIL/uL (ref 3.87–5.11)
RDW: 15.1 % (ref 11.5–15.5)
WBC: 4.4 10*3/uL (ref 4.0–10.5)

## 2015-09-26 MED ORDER — DIPHENHYDRAMINE HCL 50 MG/ML IJ SOLN
12.5000 mg | Freq: Once | INTRAMUSCULAR | Status: AC
  Administered 2015-09-26: 12.5 mg via INTRAVENOUS
  Filled 2015-09-26: qty 1

## 2015-09-26 MED ORDER — METOCLOPRAMIDE HCL 5 MG/ML IJ SOLN
10.0000 mg | Freq: Once | INTRAMUSCULAR | Status: AC
  Administered 2015-09-26: 10 mg via INTRAVENOUS
  Filled 2015-09-26: qty 2

## 2015-09-26 MED ORDER — KETOROLAC TROMETHAMINE 30 MG/ML IJ SOLN: 30.0000 mg | Freq: Once | INTRAMUSCULAR | Status: DC

## 2015-09-26 MED ORDER — SODIUM CHLORIDE 0.9 % IV BOLUS (SEPSIS)
1000.0000 mL | Freq: Once | INTRAVENOUS | Status: AC
  Administered 2015-09-26: 1000 mL via INTRAVENOUS

## 2015-09-26 NOTE — ED Notes (Signed)
Pt reports central chest tightness and right arm pain accompanied by nausea. She is also c/o nasal congestion.
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
## 2015-09-26 NOTE — Discharge Instructions (Signed)
1. Medications: usual home medications 2. Treatment: rest, drink plenty of fluids 3. Follow Up: please followup with your primary doctor this week for discussion of your diagnoses and further evaluation after today's visit; if you do not have a primary care doctor use the resource guide provided to find one; please return to the ER for chest pain, shortness of breath, severe headache, new or worsening symptoms   Chest Wall Pain Chest wall pain is pain in or around the bones and muscles of your chest. Sometimes, an injury causes this pain. Sometimes, the cause may not be known. This pain may take several weeks or longer to get better. HOME CARE INSTRUCTIONS  Pay attention to any changes in your symptoms. Take these actions to help with your pain:   Rest as told by your health care provider.   Avoid activities that cause pain. These include any activities that use your chest muscles or your abdominal and side muscles to lift heavy items.   If directed, apply ice to the painful area:  Put ice in a plastic bag.  Place a towel between your skin and the bag.  Leave the ice on for 20 minutes, 2-3 times per day.  Take over-the-counter and prescription medicines only as told by your health care provider.  Do not use tobacco products, including cigarettes, chewing tobacco, and e-cigarettes. If you need help quitting, ask your health care provider.  Keep all follow-up visits as told by your health care provider. This is important. SEEK MEDICAL CARE IF:  You have a fever.  Your chest pain becomes worse.  You have new symptoms. SEEK IMMEDIATE MEDICAL CARE IF:  You have nausea or vomiting.  You feel sweaty or light-headed.  You have a cough with phlegm (sputum) or you cough up blood.  You develop shortness of breath.   This information is not intended to replace advice given to you by your health care provider. Make sure you discuss any questions you have with your health care  provider.   Document Released: 09/25/2005 Document Revised: 06/16/2015 Document Reviewed: 12/21/2014 Elsevier Interactive Patient Education 2016 Elsevier Inc.  General Headache Without Cause A headache is pain or discomfort felt around the head or neck area. The specific cause of a headache may not be found. There are many causes and types of headaches. A few common ones are:  Tension headaches.  Migraine headaches.  Cluster headaches.  Chronic daily headaches. HOME CARE INSTRUCTIONS  Watch your condition for any changes. Take these steps to help with your condition: Managing Pain  Take over-the-counter and prescription medicines only as told by your health care provider.  Lie down in a dark, quiet room when you have a headache.  If directed, apply ice to the head and neck area:  Put ice in a plastic bag.  Place a towel between your skin and the bag.  Leave the ice on for 20 minutes, 2-3 times per day.  Use a heating pad or hot shower to apply heat to the head and neck area as told by your health care provider.  Keep lights dim if bright lights bother you or make your headaches worse. Eating and Drinking  Eat meals on a regular schedule.  Limit alcohol use.  Decrease the amount of caffeine you drink, or stop drinking caffeine. General Instructions  Keep all follow-up visits as told by your health care provider. This is important.  Keep a headache journal to help find out what may trigger your headaches.  For example, write down:  What you eat and drink.  How much sleep you get.  Any change to your diet or medicines.  Try massage or other relaxation techniques.  Limit stress.  Sit up straight, and do not tense your muscles.  Do not use tobacco products, including cigarettes, chewing tobacco, or e-cigarettes. If you need help quitting, ask your health care provider.  Exercise regularly as told by your health care provider.  Sleep on a regular schedule. Get  7-9 hours of sleep, or the amount recommended by your health care provider. SEEK MEDICAL CARE IF:   Your symptoms are not helped by medicine.  You have a headache that is different from the usual headache.  You have nausea or you vomit.  You have a fever. SEEK IMMEDIATE MEDICAL CARE IF:   Your headache becomes severe.  You have repeated vomiting.  You have a stiff neck.  You have a loss of vision.  You have problems with speech.  You have pain in the eye or ear.  You have muscular weakness or loss of muscle control.  You lose your balance or have trouble walking.  You feel faint or pass out.  You have confusion.   This information is not intended to replace advice given to you by your health care provider. Make sure you discuss any questions you have with your health care provider.   Document Released: 09/25/2005 Document Revised: 06/16/2015 Document Reviewed: 01/18/2015 Elsevier Interactive Patient Education 2016 [employer].   Emergency Department Resource Guide 1) Find a Doctor and Pay Out of Pocket Although you won't have to find out who is covered by your insurance plan, it is a good idea to ask around and get recommendations. You will then need to call the office and see if the doctor you have chosen will accept you as a new patient and what types of options they offer for patients who are self-pay. Some doctors offer discounts or will set up payment plans for their patients who do not have insurance, but you will need to ask so you aren't surprised when you get to your appointment.  2) Contact Your Local Health Department Not all health departments have doctors that can see patients for sick visits, but many do, so it is worth a call to see if yours does. If you don't know where your local health department is, you can check in your phone book. The CDC also has a tool to help you locate your state's health department, and many state websites also have listings of  all of their local health departments.  3) Find a Walk-in Clinic If your illness is not likely to be very severe or complicated, you may want to try a walk in clinic. These are popping up all over the country in pharmacies, drugstores, and shopping centers. They're usually staffed by nurse practitioners or physician assistants that have been trained to treat common illnesses and complaints. They're usually fairly quick and inexpensive. However, if you have serious medical issues or chronic medical problems, these are probably not your best option.  No Primary Care Doctor: - Call Health Connect at  [phone] - they can help you locate a primary care doctor that  accepts your insurance, provides certain services, etc. - Physician Referral Service- [phone]  Chronic Pain Problems: Organization         Address  Phone   Notes  [facility]  [phone] Patients need to be referred by  their primary care doctor.   Medication Assistance: Organization         Address  Phone   Notes  [facility] [place] Medication [facility] [address] [place], [place] [address] [phone]) [phone] --Must be a resident of [place] -- Must have NO insurance coverage whatsoever (no Medicaid/ Medicare, etc.) -- The pt. MUST have a primary care doctor that directs their care regularly and follows them in the community   [facility]ist  [phone]   [employer]  [phone]    Agencies that provide inexpensive medical care: Organization         Address  Phone   Notes  [facility]  [phone]   [facility]    [phone]   [facility] [address] [place], [place] [address] [phone]) [phone]   [facility] [place] [address], [place] [phone]   Planned Parenthood    [phone]) [phone]   [facility]    [phone]) [phone]   [facility]  [address],  [place] Phone:  [phone]) [phone], Fax:  [phone]) [phone] Hours of Operation:  9 am - 6 pm, M-F.  Also accepts Medicaid/Medicare and self-pay.  [facility]  [address], [place] Phone: [phone](336) [phone], Fax: [phone]. Hours of Operation:  8:30 am - 5:30 pm, M-F.  Also accepts Medicaid and self-pay.  [facility] [place] [address], [place] Phone: [phone]) [phone]   [facility] [address], [place], [place] [phone], Ext. [phone] Mondays & Thursdays: 7-9 AM.  First 15 patients are seen on a first come, first serve basis.    Medicaid-accepting [facility] [place] Providers:  Organization         Address  Phone   Notes  [facility] [address], [place] [phone] Also accepts self-pay patients.  [facility] [address] [place], [place]  [phone]) [phone]   [facility] [address], [place] [phone]) [phone]   [facility] [address] [place] [address], [place] [phone]) [phone]   Renaye RakersVeita Bland [address], [place]   [phone] Only accepts [place] Access [place] patients after they have their name applied to their card.   Self-Pay (no insurance) in [facility]:  Organization         Address  Phone   Notes  Sickle Cell Patients, [facility] [address], [place] [phone]) [phone]   [facility] [address], [place] [phone]) [phone]   [facility] [place]  [address], [place] [phone]   [facility]/Dr. Osei-Bonsu  [address] [place] [address], [place] or [phone], [place] [phone]) [phone] Phone number for both [place] and [place] locations is the same.  [facility] [address], [place] [phone]) [phone]   [facility] [place] [address] [place] [address], [place] or [address] [phone]   [facility] [address], [place] [phone], phone; [phone], fax Sees patients 1st and 3rd Saturday of every month.  Must not qualify for public or private insurance (i.e. Medicaid, Medicare, [place] Health Choice, Veterans' Benefits)  Household income should be no more than  200% of the poverty level The clinic cannot treat you if you are pregnant or think you are pregnant  Sexually transmitted diseases are not treated at the clinic.    Dental Care: Organization         Address  Phone  Notes  [facility] Department of [facility] [address] [place], [place] [phone] Accepts children up to age 14 who are enrolled in [place] or [place] Health Choice; pregnant women with a Medicaid card; and children who have applied for Medicaid or [place] Health Choice, but were declined, whose parents can pay a reduced fee at time of service.  [facility] Department of [facility]  [address], [place] [phone] Accepts children up to age 5 who are enrolled in [place] or [place] Health Choice; pregnant women with a Medicaid card; and children who have applied for Medicaid or [place] Health Choice, but were declined, whose parents can pay a reduced fee at time of service.  [facility]  [address] [place], [place] [phone] Patients are seen by appointment only. Walk-ins are not accepted. [facility] will see patients 103 years of age and older. Monday - Tuesday (8am-5pm) Most Wednesdays (8:30-5pm) $30 per visit, cash only  [facility] Adult Dental Access PROGRAM  [address], [facility] [phone] Patients are seen by appointment only. Walk-ins are not accepted. [facility] will see patients 19 years of age and older. One Wednesday Evening (Monthly: Volunteer Based).  $30 per visit, cash only  [employer]   [phone] for adults; Children under age 71, call Graduate Pediatric Dentistry at [phone]. Children aged 65-14, please call [phone] to request a pediatric application.  Dental services are provided in all areas of dental care including fillings, crowns and bridges, complete and partial dentures, implants, gum treatment, root canals, and extractions. Preventive care is also provided. Treatment is provided to both adults and children. Patients are selected via a lottery and there is often a waiting list.   [facility] [address] [place]  [phone] [email or web address]   [facility] [address] [place], [place] [phone], Ext. [phone] Second and Fourth Thursday of each month, opens at 6:30 AM; Clinic ends at 9 AM.  Patients are seen on a first-come first-served basis, and a limited number are seen during each clinic.   [facility]  [address] [place], [place] [phone]   Eligibility Requirements You must have lived in [place], [place], or [place] counties for at least the last three months.   You cannot be eligible for state or federal sponsored [place], including [employer], [place], or [employer].   You generally cannot be eligible for healthcare insurance through your employer.    How to apply: Eligibility screenings are held every Tuesday and Wednesday afternoon from 1:00 pm until 4:00 pm. You do not need an appointment for the interview!  [facility] [address] [place], [place] [phone]   [facility] Health Department  [phone]   [facility] Health Department  [phone]   [facility] Health Department  [phone]    Behavioral Health Resources in the Community: Intensive Outpatient Programs Organization         Address  Phone  Notes  [facility] [address], [place], [place] [phone]   [facility]  Health  Outpatient [address], [place], [place] [phone]   [facility] [address] [place], [place]  [phone]   [facility] [address]  [place], [place] [phone] or [phone]   Substance Abuse Resources Organization         Address  Phone  Notes  [facility] Drug Services  [phone]   [facility]  [phone]   [facility] [place]  [phone]   Floydene Flock  [phone]   Residential & Outpatient Substance Abuse Program  [phone]   Psychological Services Organization         Address  Phone  Notes  [facility]  [phone]   [facility]  [phone]   [facility] Mental [facility] [address], [place] [phone] or [phone]    Mobile Crisis Teams Organization         Address  Phone  Notes  [facility], Mobile Crisis Care Unit  [phone]   [facility]  [address] [place], [place] [phone]   Doristine Locks [address] [place] [phone]    Self-Help/Support Groups Organization         Address  Phone             Notes  [facility] of [place] - variety of support groups  [phone]- I7437963 Call for more information  Narcotics Anonymous (NA), [facility] [address], [employer] [place]  2 meetings at this location   Statistician         Address  Phone  Notes  [facility] [address],    [place]  [phone]   [facility] Behavioral  [address], [place] [address]235, [place], [place] [phone]   [facility] [address] [place], [place] [phone] Admissions: 8am-3pm M-F  [facility] [address],    [place], [place] [phone]   [facility] [address] [place], [place], [place] [phone]   [facility] [address],  [place], [place] [phone]     [facility] - Intensive Outpatient [address], [place], [place] [phone]   [facility] (Addiction [facility] [address]  [place], [place] [phone] or [phone]   [facility] ([facility]) [address] [place], [place] [phone] Accepts Medicaid  [facility] [address],  [place] [phone] Substance Abuse/Addiction Treatment   [facility] Organization         Address  Phone  Notes  [facility]  [phone]   Angie Fava, PhD [address] [place], [place]   [phone] or [phone]   [facility] Behavioral   [address] [place], [place] [phone]   [facility] [address] [place], [place] [phone] Insurance/Medicaid/sponsorship through [facility] and Families [address]                                    [place], [place] [phone] Therapy/tele-psych/case  [facility] [address], [place] [phone]    Dr. Lolly Mustache  [phone]   [facility]  [employer] [facility] Dept. 1) [address], [place] 2) [address], [place] 3)  [address] [place]  [address], [place] [phone] °([phone] ° °([phone]) [phone]   °[place] Child Abuse Hotline (336) 342-1394 or (336) [phone] (After Hours)    ° ° ° °

## 2015-11-29 ENCOUNTER — Ambulatory Visit (INDEPENDENT_AMBULATORY_CARE_PROVIDER_SITE_OTHER): Payer: Commercial Managed Care - HMO | Admitting: Obstetrics and Gynecology

## 2015-11-29 ENCOUNTER — Encounter: Payer: Self-pay | Admitting: Obstetrics and Gynecology

## 2015-11-29 VITALS — BP 140/80 | HR 80 | Resp 14 | Ht 64.0 in | Wt 202.0 lb

## 2015-11-29 DIAGNOSIS — R030 Elevated blood-pressure reading, without diagnosis of hypertension: Secondary | ICD-10-CM

## 2015-11-29 DIAGNOSIS — N921 Excessive and frequent menstruation with irregular cycle: Secondary | ICD-10-CM

## 2015-11-29 DIAGNOSIS — Z124 Encounter for screening for malignant neoplasm of cervix: Secondary | ICD-10-CM | POA: Diagnosis not present

## 2015-11-29 DIAGNOSIS — R5383 Other fatigue: Secondary | ICD-10-CM | POA: Diagnosis not present

## 2015-11-29 DIAGNOSIS — L680 Hirsutism: Secondary | ICD-10-CM | POA: Diagnosis not present

## 2015-11-29 DIAGNOSIS — Z01419 Encounter for gynecological examination (general) (routine) without abnormal findings: Secondary | ICD-10-CM

## 2015-11-29 DIAGNOSIS — Z0000 Encounter for general adult medical examination without abnormal findings: Secondary | ICD-10-CM | POA: Diagnosis not present

## 2015-11-29 DIAGNOSIS — N946 Dysmenorrhea, unspecified: Secondary | ICD-10-CM | POA: Diagnosis not present

## 2015-11-29 LAB — CBC
HEMATOCRIT: 37.2 % (ref 36.0–46.0)
Hemoglobin: 12.2 g/dL (ref 12.0–15.0)
MCH: 25.5 pg — ABNORMAL LOW (ref 26.0–34.0)
MCHC: 32.8 g/dL (ref 30.0–36.0)
MCV: 77.8 fL — ABNORMAL LOW (ref 78.0–100.0)
MPV: 9.3 fL (ref 8.6–12.4)
Platelets: 315 10*3/uL (ref 150–400)
RBC: 4.78 MIL/uL (ref 3.87–5.11)
RDW: 16.1 % — AB (ref 11.5–15.5)
WBC: 5.1 10*3/uL (ref 4.0–10.5)

## 2015-11-29 LAB — POCT URINE PREGNANCY: PREG TEST UR: NEGATIVE

## 2015-11-29 NOTE — Patient Instructions (Addendum)
EXERCISE AND DIET:  We recommended that you start or continue a regular exercise program for good health. Regular exercise means any activity that makes your heart beat faster and makes you sweat.  We recommend exercising at least 30 minutes per day at least 3 days a week, preferably 4 or 5.  We also recommend a diet low in fat and sugar.  Inactivity, poor dietary choices and obesity can cause diabetes, heart attack, stroke, and kidney damage, among others.    ALCOHOL AND SMOKING:  Women should limit their alcohol intake to no more than 7 drinks/beers/glasses of wine (combined, not each!) per week. Moderation of alcohol intake to this level decreases your risk of breast cancer and liver damage. And of course, no recreational drugs are part of a healthy lifestyle.  And absolutely no smoking or even second hand smoke. Most people know smoking can cause heart and lung diseases, but did you know it also contributes to weakening of your bones? Aging of your skin?  Yellowing of your teeth and nails?  CALCIUM AND VITAMIN D:  Adequate intake of calcium and Vitamin D are recommended.  The recommendations for exact amounts of these supplements seem to change often, but generally speaking 600 mg of calcium (either carbonate or citrate) and 800 units of Vitamin D per day seems prudent. Certain women may benefit from higher intake of Vitamin D.  If you are among these women, your doctor will have told you during your visit.    PAP SMEARS:  Pap smears, to check for cervical cancer or precancers,  have traditionally been done yearly, although recent scientific advances have shown that most women can have pap smears less often.  However, every woman still should have a physical exam from her gynecologist every year. It will include a breast check, inspection of the vulva and vagina to check for abnormal growths or skin changes, a visual exam of the cervix, and then an exam to evaluate the size and shape of the uterus and  ovaries.  And after 43 years of age, a rectal exam is indicated to check for rectal cancers. We will also provide age appropriate advice regarding health maintenance, like when you should have certain vaccines, screening for sexually transmitted diseases, bone density testing, colonoscopy, mammograms, etc.   MAMMOGRAMS:  All women over 40 years old should have a yearly mammogram. Many facilities now offer a "3D" mammogram, which may cost around $50 extra out of pocket. If possible,  we recommend you accept the option to have the 3D mammogram performed.  It both reduces the number of women who will be called back for extra views which then turn out to be normal, and it is better than the routine mammogram at detecting truly abnormal areas.    COLONOSCOPY:  Colonoscopy to screen for colon cancer is recommended for all women at age 50.  We know, you hate the idea of the prep.  We agree, BUT, having colon cancer and not knowing it is worse!!  Colon cancer so often starts as a polyp that can be seen and removed at colonscopy, which can quite literally save your life!  And if your first colonoscopy is normal and you have no family history of colon cancer, most women don't have to have it again for 10 years.  Once every ten years, you can do something that may end up saving your life, right?  We will be happy to help you get it scheduled when you are ready.    Be sure to check your insurance coverage so you understand how much it will cost.  It may be covered as a preventative service at no cost, but you should check your particular policy.     Kegel Exercises The goal of Kegel exercises is to isolate and exercise your pelvic floor muscles. These muscles act as a hammock that supports the rectum, vagina, small intestine, and uterus. As the muscles weaken, the hammock sags and these organs are displaced from their normal positions. Kegel exercises can strengthen your pelvic floor muscles and help you to improve  bladder and bowel control, improve sexual response, and help reduce many problems and some discomfort during pregnancy. Kegel exercises can be done anywhere and at any time. HOW TO PERFORM KEGEL EXERCISES 1. Locate your pelvic floor muscles. To do this, squeeze (contract) the muscles that you use when you try to stop the flow of urine. You will feel a tightness in the vaginal area (women) and a tight lift in the rectal area (men and women). 2. When you begin, contract your pelvic muscles tight for 2-5 seconds, then relax them for 2-5 seconds. This is one set. Do 4-5 sets with a short pause in between. 3. Contract your pelvic muscles for 8-10 seconds, then relax them for 8-10 seconds. Do 4-5 sets. If you cannot contract your pelvic muscles for 8-10 seconds, try 5-7 seconds and work your way up to 8-10 seconds. Your goal is 4-5 sets of 10 contractions each day. Keep your stomach, buttocks, and legs relaxed during the exercises. Perform sets of both short and long contractions. Vary your positions. Perform these contractions 3-4 times per day. Perform sets while you are:   Lying in bed in the morning.  Standing at lunch.  Sitting in the late afternoon.  Lying in bed at night. You should do 40-50 contractions per day. Do not perform more Kegel exercises per day than recommended. Overexercising can cause muscle fatigue. Continue these exercises for for at least 15-20 weeks or as directed by your caregiver.   This information is not intended to replace advice given to you by your health care provider. Make sure you discuss any questions you have with your health care provider.   Document Released: 09/11/2012 Document Revised: 10/16/2014 Document Reviewed: 09/11/2012 Elsevier Interactive Patient Education 2016 [employer]  
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
## 2015-11-30 LAB — LIPID PANEL
CHOL/HDL RATIO: 4.1 ratio (ref <=5.0)
Cholesterol: 135 mg/dL (ref 125–200)
HDL: 33 mg/dL — AB (ref >=46)
LDL CALC: 88 mg/dL (ref <130)
Triglycerides: 68 mg/dL (ref <150)
VLDL: 14 mg/dL (ref <30)

## 2015-11-30 LAB — COMPREHENSIVE METABOLIC PANEL
ALT: 19 U/L (ref 6–29)
AST: 16 U/L (ref 10–30)
Albumin: 3.9 g/dL (ref 3.6–5.1)
Alkaline Phosphatase: 64 U/L (ref 33–115)
BILIRUBIN TOTAL: 0.3 mg/dL (ref 0.2–1.2)
BUN: 9 mg/dL (ref 7–25)
CALCIUM: 8.9 mg/dL (ref 8.6–10.2)
CO2: 23 mmol/L (ref 20–31)
Chloride: 107 mmol/L (ref 98–110)
Creat: 0.71 mg/dL (ref 0.50–1.10)
GLUCOSE: 103 mg/dL — AB (ref 65–99)
Potassium: 4 mmol/L (ref 3.5–5.3)
Sodium: 139 mmol/L (ref 135–146)
Total Protein: 7.1 g/dL (ref 6.1–8.1)

## 2015-11-30 LAB — FERRITIN: FERRITIN: 20 ng/mL (ref 10–232)

## 2015-11-30 LAB — TESTOSTERONE: TESTOSTERONE: 55 ng/dL

## 2015-11-30 LAB — VITAMIN D 25 HYDROXY (VIT D DEFICIENCY, FRACTURES): Vit D, 25-Hydroxy: 20 ng/mL — ABNORMAL LOW (ref 30–100)

## 2015-11-30 LAB — IPS PAP TEST WITH HPV

## 2015-11-30 LAB — TSH: TSH: 1.75 mIU/L

## 2015-11-30 LAB — DHEA-SULFATE: DHEA SO4: 124 ug/dL (ref 19–231)

## 2015-12-03 ENCOUNTER — Telehealth: Payer: Self-pay | Admitting: Obstetrics and Gynecology

## 2015-12-03 ENCOUNTER — Telehealth: Payer: Self-pay | Admitting: Emergency Medicine

## 2015-12-03 DIAGNOSIS — Z7689 Persons encountering health services in other specified circumstances: Secondary | ICD-10-CM

## 2015-12-03 LAB — 17-HYDROXYPROGESTERONE: 17-OH-PROGESTERONE, LC/MS/MS: 62 ng/dL

## 2015-12-03 MED ORDER — NAPROXEN SODIUM 550 MG PO TABS: ORAL_TABLET | ORAL | Status: DC

## 2015-12-03 NOTE — Telephone Encounter (Signed)
Notes Recorded by Romualdo Bolk, MD on 12/03/2015 at 11:11 AM 02 Please inform the patient that she is deficient in vit D and should start taking 1,000 IU a day. Her labs for hair growth are okay, her testosterone is minimally elevated, not significant. Her total cholesterol is fine, but her HDL (good cholesterol) is low, which increases her risk for heart disease. She should exercise at least 30 minutes 3 x a week and eat a diet low in saturated fat.  The rest of her blood work was normal. Her glucose was slightly elevated, but only for a fasting level (which she wasn't).

## 2015-12-03 NOTE — Telephone Encounter (Signed)
Spoke with patient. Advised Anaprox prescription sent, instructions given.   Patient given results of pap smear and blood work.  02 recall-Patient has annual exam scheduled for 11/29/16.  Patient verbalized understanding of results and will follow up as scheduled. She requests referral for primary care, and referral is placed. Advised to call back to our office if she has not received a phone call to establish care.  Routing to provider for final review. Patient agreeable to disposition. Will close encounter.

## 2015-12-03 NOTE — Telephone Encounter (Signed)
-----   Message from Romualdo Bolk, MD sent at 12/03/2015 11:11 AM EST ----- 02 Please inform the patient that she is deficient in vit D and should start taking 1,000 IU a day. Her labs for hair growth are okay, her testosterone is minimally elevated, not significant. Her total cholesterol is fine, but her HDL (good cholesterol) is low, which increases her risk for heart disease. She should exercise at least 30 minutes 3 x a week and eat a diet low in saturated fat.  The rest of her blood work was normal. Her glucose was slightly elevated, but only for a fasting level (which she wasn't).

## 2015-12-03 NOTE — Telephone Encounter (Signed)
1. Patient called and said her pharmacy on file does not have her pain medicine prescription.  2. Patient requesting recent test results.l

## 2015-12-03 NOTE — Telephone Encounter (Signed)
Dr. Edward Jolly,  Dr. Salli Quarry note indicate that a prescription for Anaprox for cramps would be placed. Can you review and advise?

## 2015-12-03 NOTE — Telephone Encounter (Signed)
Spoke with patient. Advised Anaprox prescription sent, instructions given.  Patient given results of pap smear and blood work.  02 recall-Patient has annual exam scheduled for 11/29/16.  Patient verbalized understanding of results and will follow up as scheduled. She requests referral for primary care, and referral is placed. Advised to call back to our office if she has not received a phone call to establish care.

## 2015-12-03 NOTE — Telephone Encounter (Signed)
Reviewed with Dr. Oscar La and orders received for Anaprox DS 550 mg 1 tablet po Q 12 hours as needed #30 with two refills.  Rx sent.  Will review labs when resulted by Dr. Oscar La.  Message left to return call to Wilder at [phone].

## 2015-12-03 NOTE — Telephone Encounter (Signed)
I have reviewed your note and will help if needed.

## 2015-12-03 NOTE — Addendum Note (Signed)
Addended by: Joeseph Amor on: 12/03/2015 12:15 PM   Modules accepted: Orders

## 2015-12-18 ENCOUNTER — Encounter (HOSPITAL_COMMUNITY): Payer: Self-pay | Admitting: Emergency Medicine

## 2015-12-18 ENCOUNTER — Emergency Department (HOSPITAL_COMMUNITY)
Admission: EM | Admit: 2015-12-18 | Discharge: 2015-12-18 | Disposition: A | Discharge status: Home / Self Care | Payer: Commercial Managed Care - HMO | Source: Home / Self Care

## 2015-12-18 DIAGNOSIS — R51 Headache: Secondary | ICD-10-CM

## 2015-12-18 DIAGNOSIS — R519 Headache, unspecified: Secondary | ICD-10-CM

## 2015-12-18 MED ORDER — DEXAMETHASONE SODIUM PHOSPHATE 10 MG/ML IJ SOLN
INTRAMUSCULAR | Status: AC
  Filled 2015-12-18: qty 1

## 2015-12-18 MED ORDER — DEXAMETHASONE SODIUM PHOSPHATE 10 MG/ML IJ SOLN
10.0000 mg | Freq: Once | INTRAMUSCULAR | Status: AC
  Administered 2015-12-18: 10 mg via INTRAMUSCULAR

## 2015-12-18 MED ORDER — SUMATRIPTAN SUCCINATE 6 MG/0.5ML ~~LOC~~ SOLN: 6.0000 mg | Freq: Once | SUBCUTANEOUS | Status: DC

## 2015-12-18 MED ORDER — SUMATRIPTAN SUCCINATE 50 MG PO TABS: 50.0000 mg | ORAL_TABLET | ORAL | Status: DC | PRN

## 2015-12-18 MED ORDER — METOCLOPRAMIDE HCL 5 MG/ML IJ SOLN
INTRAMUSCULAR | Status: AC
  Filled 2015-12-18: qty 2

## 2015-12-18 MED ORDER — METOCLOPRAMIDE HCL 5 MG/ML IJ SOLN
5.0000 mg | Freq: Once | INTRAMUSCULAR | Status: AC
  Administered 2015-12-18: 5 mg via INTRAMUSCULAR
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
## 2015-12-18 NOTE — ED Notes (Signed)
Pt here with tension headache that radiates down neck and and shoulders Started 3 days ago, sinus sx's reported as well Sinus meds not working Nausea associated with sx's Taking Tylenol and sinus medication

## 2015-12-18 NOTE — Discharge Instructions (Signed)

## 2016-01-05 ENCOUNTER — Ambulatory Visit: Payer: Commercial Managed Care - HMO | Admitting: Family

## 2016-01-08 ENCOUNTER — Telehealth: Payer: Self-pay | Admitting: Obstetrics and Gynecology

## 2016-01-08 NOTE — Telephone Encounter (Signed)
Records requested from her tubal and subsequent complications. Not available, over 10 years ago.

## 2016-01-13 ENCOUNTER — Ambulatory Visit (INDEPENDENT_AMBULATORY_CARE_PROVIDER_SITE_OTHER): Payer: Commercial Managed Care - HMO | Admitting: Family

## 2016-01-13 ENCOUNTER — Encounter: Payer: Self-pay | Admitting: Family

## 2016-01-13 VITALS — BP 132/96 | HR 73 | Temp 98.3°F | Resp 16 | Ht 64.0 in | Wt 201.0 lb

## 2016-01-13 DIAGNOSIS — I10 Essential (primary) hypertension: Secondary | ICD-10-CM

## 2016-01-13 DIAGNOSIS — G43009 Migraine without aura, not intractable, without status migrainosus: Secondary | ICD-10-CM | POA: Insufficient documentation

## 2016-01-13 MED ORDER — PROPRANOLOL HCL ER 80 MG PO CP24: 80.0000 mg | ORAL_CAPSULE | Freq: Every day | ORAL | Status: DC

## 2016-01-13 NOTE — Assessment & Plan Note (Signed)
Previously diagnosed with hypertension and taken off medication with weight loss, however has recently gained weight and blood pressure has been elevated on multiple occasions. Blood pressure today remains above goal 140/90. Start propranolol. Encouraged to monitor blood pressure at home. Advised to use lifestyle management including physical activity and nutritional changes. No symptoms of end organ damage. Follow-up in one month or sooner.

## 2016-01-13 NOTE — Assessment & Plan Note (Signed)
Symptoms and exam consistent with mixed headaches and most likely migraines without aura. Neurological exam benign. Previous imaging was negative per patient with no imaging available for review. Continue current dosage of sumatriptan as needed for headaches. Start propranolol for headache prevention and hypertension. Denies worse headache of her life. Follow-up in one month or sooner.
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
## 2016-01-13 NOTE — Progress Notes (Signed)
Pre visit review using our clinic review tool, if applicable. No additional management support is needed unless otherwise documented below in the visit note. 

## 2016-01-13 NOTE — Patient Instructions (Signed)
Thank you for choosing [facility].  Summary/Instructions:  Your prescription(s) have been submitted to your pharmacy or been printed and provided for you. Please take as directed and contact our office if you believe you are having problem(s) with the medication(s) or have any questions.  If your symptoms worsen or fail to improve, please contact our office for further instruction, or in case of emergency go directly to the emergency room at the closest medical facility.   Migraine Headache A migraine headache is an intense, throbbing pain on one or both sides of your head. A migraine can last for 30 minutes to several hours. CAUSES  The exact cause of a migraine headache is not always known. However, a migraine may be caused when nerves in the brain become irritated and release chemicals that cause inflammation. This causes pain. Certain things may also trigger migraines, such as:  Alcohol.  Smoking.  Stress.  Menstruation.  Aged cheeses.  Foods or drinks that contain nitrates, glutamate, aspartame, or tyramine.  Lack of sleep.  Chocolate.  Caffeine.  Hunger.  Physical exertion.  Fatigue.  Medicines used to treat chest pain (nitroglycerine), birth control pills, estrogen, and some blood pressure medicines. SIGNS AND SYMPTOMS  Pain on one or both sides of your head.  Pulsating or throbbing pain.  Severe pain that prevents daily activities.  Pain that is aggravated by any physical activity.  Nausea, vomiting, or both.  Dizziness.  Pain with exposure to bright lights, loud noises, or activity.  General sensitivity to bright lights, loud noises, or smells. Before you get a migraine, you may get warning signs that a migraine is coming (aura). An aura may include:  Seeing flashing lights.  Seeing bright spots, halos, or zigzag lines.  Having tunnel vision or blurred vision.  Having feelings of numbness or tingling.  Having trouble  talking.  Having muscle weakness. DIAGNOSIS  A migraine headache is often diagnosed based on:  Symptoms.  Physical exam.  A CT scan or MRI of your head. These imaging tests cannot diagnose migraines, but they can help rule out other causes of headaches. TREATMENT Medicines may be given for pain and nausea. Medicines can also be given to help prevent recurrent migraines.  HOME CARE INSTRUCTIONS  Only take over-the-counter or prescription medicines for pain or discomfort as directed by your health care provider. The use of long-term narcotics is not recommended.  Lie down in a dark, quiet room when you have a migraine.  Keep a journal to find out what may trigger your migraine headaches. For example, write down:  What you eat and drink.  How much sleep you get.  Any change to your diet or medicines.  Limit alcohol consumption.  Quit smoking if you smoke.  Get 7-9 hours of sleep, or as recommended by your health care provider.  Limit stress.  Keep lights dim if bright lights bother you and make your migraines worse. SEEK IMMEDIATE MEDICAL CARE IF:   Your migraine becomes severe.  You have a fever.  You have a stiff neck.  You have vision loss.  You have muscular weakness or loss of muscle control.  You start losing your balance or have trouble walking.  You feel faint or pass out.  You have severe symptoms that are different from your first symptoms. MAKE SURE YOU:   Understand these instructions.  Will watch your condition.  Will get help right away if you are not doing well or get worse.   This information  is not intended to replace advice given to you by your health care provider. Make sure you discuss any questions you have with your health care provider.   Document Released: 09/25/2005 Document Revised: 10/16/2014 Document Reviewed: 06/02/2013 Elsevier Interactive Patient Education 2016 [employer].  Hypertension Hypertension, commonly called  high blood pressure, is when the force of blood pumping through your arteries is too strong. Your arteries are the blood vessels that carry blood from your heart throughout your body. A blood pressure reading consists of a higher number over a lower number, such as 110/72. The higher number (systolic) is the pressure inside your arteries when your heart pumps. The lower number (diastolic) is the pressure inside your arteries when your heart relaxes. Ideally you want your blood pressure below 120/80. Hypertension forces your heart to work harder to pump blood. Your arteries may become narrow or stiff. Having untreated or uncontrolled hypertension can cause heart attack, stroke, kidney disease, and other problems. RISK FACTORS Some risk factors for high blood pressure are controllable. Others are not.  Risk factors you cannot control include:   Race. You may be at higher risk if you are African American.  Age. Risk increases with age.  Gender. Men are at higher risk than women before age 42 years. After age 57, women are at higher risk than men. Risk factors you can control include:  Not getting enough exercise or physical activity.  Being overweight.  Getting too much fat, sugar, calories, or salt in your diet.  Drinking too much alcohol. SIGNS AND SYMPTOMS Hypertension does not usually cause signs or symptoms. Extremely high blood pressure (hypertensive crisis) may cause headache, anxiety, shortness of breath, and nosebleed. DIAGNOSIS To check if you have hypertension, your health care provider will measure your blood pressure while you are seated, with your arm held at the level of your heart. It should be measured at least twice using the same arm. Certain conditions can cause a difference in blood pressure between your right and left arms. A blood pressure reading that is higher than normal on one occasion does not mean that you need treatment. If it is not clear whether you have high blood  pressure, you may be asked to return on a different day to have your blood pressure checked again. Or, you may be asked to monitor your blood pressure at home for 1 or more weeks. TREATMENT Treating high blood pressure includes making lifestyle changes and possibly taking medicine. Living a healthy lifestyle can help lower high blood pressure. You may need to change some of your habits. Lifestyle changes may include:  Following the DASH diet. This diet is high in fruits, vegetables, and whole grains. It is low in salt, red meat, and added sugars.  Keep your sodium intake below 2,300 mg per day.  Getting at least 30-45 minutes of aerobic exercise at least 4 times per week.  Losing weight if necessary.  Not smoking.  Limiting alcoholic beverages.  Learning ways to reduce stress. Your health care provider may prescribe medicine if lifestyle changes are not enough to get your blood pressure under control, and if one of the following is true:  You are 22-14 years of age and your systolic blood pressure is above 140.  You are 71 years of age or older, and your systolic blood pressure is above 150.  Your diastolic blood pressure is above 90.  You have diabetes, and your systolic blood pressure is over 140 or your diastolic blood pressure  is over 90.  You have kidney disease and your blood pressure is above 140/90.  You have heart disease and your blood pressure is above 140/90. Your personal target blood pressure may vary depending on your medical conditions, your age, and other factors. HOME CARE INSTRUCTIONS  Have your blood pressure rechecked as directed by your health care provider.   Take medicines only as directed by your health care provider. Follow the directions carefully. Blood pressure medicines must be taken as prescribed. The medicine does not work as well when you skip doses. Skipping doses also puts you at risk for problems.  Do not smoke.   Monitor your blood  pressure at home as directed by your health care provider. SEEK MEDICAL CARE IF:   You think you are having a reaction to medicines taken.  You have recurrent headaches or feel dizzy.  You have swelling in your ankles.  You have trouble with your vision. SEEK IMMEDIATE MEDICAL CARE IF:  You develop a severe headache or confusion.  You have unusual weakness, numbness, or feel faint.  You have severe chest or abdominal pain.  You vomit repeatedly.  You have trouble breathing. MAKE SURE YOU:   Understand these instructions.  Will watch your condition.  Will get help right away if you are not doing well or get worse.   This information is not intended to replace advice given to you by your health care provider. Make sure you discuss any questions you have with your health care provider.   Document Released: 09/25/2005 Document Revised: 02/09/2015 Document Reviewed: 07/18/2013 Elsevier Interactive Patient Education 2016 Elsevier Inc.  DASH Eating Plan DASH stands for "Dietary Approaches to Stop Hypertension." The DASH eating plan is a healthy eating plan that has been shown to reduce high blood pressure (hypertension). Additional health benefits may include reducing the risk of type 2 diabetes mellitus, heart disease, and stroke. The DASH eating plan may also help with weight loss. WHAT DO I NEED TO KNOW ABOUT THE DASH EATING PLAN? For the DASH eating plan, you will follow these general guidelines:  Choose foods with a percent daily value for sodium of less than 5% (as listed on the food label).  Use salt-free seasonings or herbs instead of table salt or sea salt.  Check with your health care provider or pharmacist before using salt substitutes.  Eat lower-sodium products, often labeled as "lower sodium" or "no salt added."  Eat fresh foods.  Eat more vegetables, fruits, and low-fat dairy products.  Choose whole grains. Look for the word "whole" as the first word in  the ingredient list.  Choose fish and skinless chicken or [place] more often than red meat. Limit fish, poultry, and meat to 6 oz (170 g) each day.  Limit sweets, desserts, sugars, and sugary drinks.  Choose heart-healthy fats.  Limit cheese to 1 oz (28 g) per day.  Eat more home-cooked food and less restaurant, buffet, and fast food.  Limit fried foods.  Cook foods using methods other than frying.  Limit canned vegetables. If you do use them, rinse them well to decrease the sodium.  When eating at a restaurant, ask that your food be prepared with less salt, or no salt if possible. WHAT FOODS CAN I EAT? Seek help from a dietitian for individual calorie needs. Grains Whole grain or whole wheat bread. Brown rice. Whole grain or whole wheat pasta. Quinoa, bulgur, and whole grain cereals. Low-sodium cereals. Corn or whole wheat flour tortillas. Whole grain  cornbread. Whole grain crackers. Low-sodium crackers. Vegetables Fresh or frozen vegetables (raw, steamed, roasted, or grilled). Low-sodium or reduced-sodium tomato and vegetable juices. Low-sodium or reduced-sodium tomato sauce and paste. Low-sodium or reduced-sodium canned vegetables.  Fruits All fresh, canned (in natural juice), or frozen fruits. Meat and Other Protein Products Ground beef (85% or leaner), grass-fed beef, or beef trimmed of fat. Skinless chicken or [place]. Ground chicken or [place]. Pork trimmed of fat. All fish and seafood. Eggs. Dried beans, peas, or lentils. Unsalted nuts and seeds. Unsalted canned beans. Dairy Low-fat dairy products, such as skim or 1% milk, 2% or reduced-fat cheeses, low-fat ricotta or cottage cheese, or plain low-fat yogurt. Low-sodium or reduced-sodium cheeses. Fats and Oils Tub margarines without trans fats. Light or reduced-fat mayonnaise and salad dressings (reduced sodium). Avocado. Safflower, olive, or canola oils. Natural peanut or almond butter. Other Unsalted popcorn and pretzels. The  items listed above may not be a complete list of recommended foods or beverages. Contact your dietitian for more options. WHAT FOODS ARE NOT RECOMMENDED? Grains White bread. White pasta. White rice. Refined cornbread. Bagels and croissants. Crackers that contain trans fat. Vegetables Creamed or fried vegetables. Vegetables in a cheese sauce. Regular canned vegetables. Regular canned tomato sauce and paste. Regular tomato and vegetable juices. Fruits Dried fruits. Canned fruit in light or heavy syrup. Fruit juice. Meat and Other Protein Products Fatty cuts of meat. Ribs, chicken wings, bacon, sausage, bologna, salami, chitterlings, fatback, hot dogs, bratwurst, and packaged luncheon meats. Salted nuts and seeds. Canned beans with salt. Dairy Whole or 2% milk, cream, half-and-half, and cream cheese. Whole-fat or sweetened yogurt. Full-fat cheeses or blue cheese. Nondairy creamers and whipped toppings. Processed cheese, cheese spreads, or cheese curds. Condiments Onion and garlic salt, seasoned salt, table salt, and sea salt. Canned and packaged gravies. Worcestershire sauce. Tartar sauce. Barbecue sauce. Teriyaki sauce. Soy sauce, including reduced sodium. Steak sauce. Fish sauce. Oyster sauce. Cocktail sauce. Horseradish. Ketchup and mustard. Meat flavorings and tenderizers. Bouillon cubes. Hot sauce. Tabasco sauce. Marinades. Taco seasonings. Relishes. Fats and Oils Butter, stick margarine, lard, shortening, ghee, and bacon fat. Coconut, palm kernel, or palm oils. Regular salad dressings. Other Pickles and olives. Salted popcorn and pretzels. The items listed above may not be a complete list of foods and beverages to avoid. Contact your dietitian for more information. WHERE CAN I FIND MORE INFORMATION? National Heart, Lung, and Blood Institute: [employer]   This information is not intended to replace advice given to you by your health care provider. Make  sure you discuss any questions you have with your health care provider.   Document Released: 09/14/2011 Document Revised: 10/16/2014 Document Reviewed: 07/30/2013 Elsevier Interactive Patient Education [employer]

## 2016-01-27 ENCOUNTER — Ambulatory Visit: Payer: 59 | Admitting: Family

## 2016-03-09 DIAGNOSIS — R7309 Other abnormal glucose: Secondary | ICD-10-CM

## 2016-03-09 DIAGNOSIS — R768 Other specified abnormal immunological findings in serum: Secondary | ICD-10-CM

## 2016-03-09 HISTORY — DX: Other abnormal glucose: R73.09

## 2016-03-09 HISTORY — DX: Other specified abnormal immunological findings in serum: R76.8

## 2016-03-30 ENCOUNTER — Telehealth: Payer: Self-pay | Admitting: Obstetrics and Gynecology
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
## 2016-03-30 NOTE — Telephone Encounter (Signed)
Patient is calling with UTI symptoms, offered appointment today at 3:00 with Dr. Edward JollySilva. Patient unable to come in today and requests appointment next week.    Routing to triage.

## 2016-04-03 ENCOUNTER — Encounter: Payer: Self-pay | Admitting: Obstetrics and Gynecology

## 2016-04-03 ENCOUNTER — Ambulatory Visit (INDEPENDENT_AMBULATORY_CARE_PROVIDER_SITE_OTHER): Payer: Commercial Managed Care - HMO | Admitting: Obstetrics and Gynecology

## 2016-04-03 ENCOUNTER — Other Ambulatory Visit: Payer: Self-pay | Admitting: Obstetrics and Gynecology

## 2016-04-03 VITALS — BP 110/80 | HR 60 | Resp 18 | Ht 64.0 in | Wt 205.0 lb

## 2016-04-03 DIAGNOSIS — N9489 Other specified conditions associated with female genital organs and menstrual cycle: Secondary | ICD-10-CM | POA: Diagnosis not present

## 2016-04-03 DIAGNOSIS — R319 Hematuria, unspecified: Secondary | ICD-10-CM

## 2016-04-03 DIAGNOSIS — R102 Pelvic and perineal pain: Secondary | ICD-10-CM

## 2016-04-03 DIAGNOSIS — Z113 Encounter for screening for infections with a predominantly sexual mode of transmission: Secondary | ICD-10-CM | POA: Diagnosis not present

## 2016-04-03 DIAGNOSIS — R140 Abdominal distension (gaseous): Secondary | ICD-10-CM

## 2016-04-03 DIAGNOSIS — R5383 Other fatigue: Secondary | ICD-10-CM

## 2016-04-03 LAB — CBC
HEMATOCRIT: 39.1 % (ref 35.0–45.0)
Hemoglobin: 12.7 g/dL (ref 11.7–15.5)
MCH: 25.6 pg — ABNORMAL LOW (ref 27.0–33.0)
MCHC: 32.5 g/dL (ref 32.0–36.0)
MCV: 78.7 fL — AB (ref 80.0–100.0)
MPV: 9.8 fL (ref 7.5–12.5)
Platelets: 235 10*3/uL (ref 140–400)
RBC: 4.97 MIL/uL (ref 3.80–5.10)
RDW: 16.3 % — ABNORMAL HIGH (ref 11.0–15.0)
WBC: 4.3 10*3/uL (ref 3.8–10.8)

## 2016-04-03 LAB — COMPREHENSIVE METABOLIC PANEL
ALK PHOS: 61 U/L (ref 33–115)
ALT: 13 U/L (ref 6–29)
AST: 13 U/L (ref 10–30)
Albumin: 3.9 g/dL (ref 3.6–5.1)
BILIRUBIN TOTAL: 0.5 mg/dL (ref 0.2–1.2)
BUN: 9 mg/dL (ref 7–25)
CALCIUM: 8.7 mg/dL (ref 8.6–10.2)
CO2: 24 mmol/L (ref 20–31)
Chloride: 106 mmol/L (ref 98–110)
Creat: 0.62 mg/dL (ref 0.50–1.10)
Glucose, Bld: 113 mg/dL — ABNORMAL HIGH (ref 65–99)
POTASSIUM: 4.2 mmol/L (ref 3.5–5.3)
Sodium: 138 mmol/L (ref 135–146)
TOTAL PROTEIN: 6.9 g/dL (ref 6.1–8.1)

## 2016-04-03 LAB — POCT URINALYSIS DIPSTICK
BILIRUBIN UA: NEGATIVE
GLUCOSE UA: NEGATIVE
Ketones, UA: NEGATIVE
LEUKOCYTES UA: NEGATIVE
NITRITE UA: NEGATIVE
Protein, UA: NEGATIVE
Urobilinogen, UA: NEGATIVE
pH, UA: 5

## 2016-04-03 LAB — TSH: TSH: 1.4 mIU/L

## 2016-04-03 MED ORDER — MEDROXYPROGESTERONE ACETATE 10 MG PO TABS: 10.0000 mg | ORAL_TABLET | Freq: Every day | ORAL | Status: DC
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
## 2016-04-04 LAB — URINALYSIS, MICROSCOPIC ONLY
BACTERIA UA: NONE SEEN [HPF]
Casts: NONE SEEN [LPF]
Crystals: NONE SEEN [HPF]
SQUAMOUS EPITHELIAL / LPF: NONE SEEN [HPF] (ref <=5)
WBC UA: NONE SEEN WBC/HPF (ref <=5)
YEAST: NONE SEEN [HPF]

## 2016-04-04 LAB — STD PANEL
HEP B S AG: NEGATIVE
HIV: NONREACTIVE

## 2016-04-04 LAB — WET PREP BY MOLECULAR PROBE
CANDIDA SPECIES: NEGATIVE
GARDNERELLA VAGINALIS: NEGATIVE
TRICHOMONAS VAG: NEGATIVE

## 2016-04-04 LAB — URINE CULTURE
COLONY COUNT: NO GROWTH
ORGANISM ID, BACTERIA: NO GROWTH

## 2016-04-04 LAB — HEPATITIS C ANTIBODY: HCV Ab: NEGATIVE

## 2016-04-05 LAB — IPS N GONORRHOEA AND CHLAMYDIA BY PCR

## 2016-04-06 ENCOUNTER — Encounter: Payer: Self-pay | Admitting: Obstetrics and Gynecology

## 2016-04-06 LAB — HSV(HERPES SMPLX)ABS-I+II(IGG+IGM)-BLD: HERPES SIMPLEX VRS I-IGM AB (EIA): 0.33 {index}

## 2016-04-07 ENCOUNTER — Telehealth: Payer: Self-pay

## 2016-04-07 LAB — HEMOGLOBIN A1C
HEMOGLOBIN A1C: 6.3 % — AB (ref <5.7)
MEAN PLASMA GLUCOSE: 134 mg/dL

## 2016-04-07 NOTE — Telephone Encounter (Signed)
Spoke with patient. Advised of message and results ans seen below from Dr.Silva. She is agreeable and verbalizes understanding of all results.  Routing to provider for final review. Patient agreeable to disposition. Will close encounter.

## 2016-04-07 NOTE — Telephone Encounter (Signed)
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  Her antibodies indicate she has had this exposure at some time.  It may not have been recently because her IgM aby is negative. I believe this is new information for her, and she may want to come in for an office visit to discuss this further.   Her testing is negative for gonorrhea, chlamydia, HIV, hepatitis B and C, and syphilis.  Affirm is negative for vaginitis.  Her urine culture is negative.   Her glucose was elevated slightly, and I am asking for a hemoglobin A1C to see if there is sign of prediabetes or diabetes.    Her thyroid was normal.   Her CBC showed an alteration in her RBC indices, and I am checking to see if we can also add an iron level.   Cc- Claudette LawsAmanda Dixon

## 2016-04-08 ENCOUNTER — Encounter: Payer: Self-pay | Admitting: Obstetrics and Gynecology

## 2016-04-08 LAB — IRON AND TIBC
%SAT: 14 % (ref 11–50)
Iron: 60 ug/dL (ref 40–190)
TIBC: 425 ug/dL (ref 250–450)
UIBC: 365 ug/dL (ref 125–400)

## 2016-04-13 ENCOUNTER — Telehealth: Payer: Self-pay | Admitting: *Deleted

## 2016-04-13 NOTE — Telephone Encounter (Signed)
Spoke with patient -see result note -eh 

## 2016-04-13 NOTE — Telephone Encounter (Signed)
Left message to call in regards to lab results -eh 

## 2016-04-13 NOTE — Telephone Encounter (Signed)
-----   Message from Patton SallesBrook E Amundson C Silva, MD sent at 04/08/2016  8:05 PM EDT ----- Please contact patient regarding her hemoglobin A1C which indicates elevated blood sugar in a prediabetic range.  This may explain some of her fatigue and urinary frequency.  She indicated significant weight gain, and this has contributed to her elevated blood sugar.  Following a diet low in sugar and carbohydrate, increasing her vigorous exercise, and weight loss can reduce her blood sugar.   I would like her to follow up with a PCP or establish care with a PCP if she does not have one.  She has other testing that I did to check her iron studies, and these were normal. I will send this through in a separate note.  Cc- Claudette LawsAmanda Dixon

## 2016-04-18 ENCOUNTER — Encounter: Payer: Self-pay | Admitting: Internal Medicine

## 2016-04-20 ENCOUNTER — Encounter (HOSPITAL_COMMUNITY): Payer: Self-pay | Admitting: *Deleted

## 2016-04-20 ENCOUNTER — Emergency Department (HOSPITAL_COMMUNITY): Payer: Commercial Managed Care - HMO

## 2016-04-20 DIAGNOSIS — R079 Chest pain, unspecified: Secondary | ICD-10-CM | POA: Insufficient documentation

## 2016-04-20 DIAGNOSIS — I10 Essential (primary) hypertension: Secondary | ICD-10-CM | POA: Diagnosis not present

## 2016-04-20 LAB — I-STAT TROPONIN, ED: TROPONIN I, POC: 0 ng/mL (ref 0.00–0.08)

## 2016-04-20 LAB — CBC
HCT: 38.9 % (ref 36.0–46.0)
HEMOGLOBIN: 12.6 g/dL (ref 12.0–15.0)
MCH: 25.7 pg — ABNORMAL LOW (ref 26.0–34.0)
MCHC: 32.4 g/dL (ref 30.0–36.0)
MCV: 79.4 fL (ref 78.0–100.0)
Platelets: 293 10*3/uL (ref 150–400)
RBC: 4.9 MIL/uL (ref 3.87–5.11)
RDW: 15.5 % (ref 11.5–15.5)
WBC: 6.5 10*3/uL (ref 4.0–10.5)

## 2016-04-20 LAB — BASIC METABOLIC PANEL
ANION GAP: 6 (ref 5–15)
BUN: 12 mg/dL (ref 6–20)
CALCIUM: 9.2 mg/dL (ref 8.9–10.3)
CO2: 25 mmol/L (ref 22–32)
Chloride: 106 mmol/L (ref 101–111)
Creatinine, Ser: 0.83 mg/dL (ref 0.44–1.00)
Glucose, Bld: 119 mg/dL — ABNORMAL HIGH (ref 65–99)
Potassium: 3.4 mmol/L — ABNORMAL LOW (ref 3.5–5.1)
Sodium: 137 mmol/L (ref 135–145)

## 2016-04-20 NOTE — ED Notes (Signed)
Pt c/o left sided chest pain with radiation to left arm and back.

## 2016-04-21 ENCOUNTER — Emergency Department (HOSPITAL_COMMUNITY)
Admission: EM | Admit: 2016-04-21 | Discharge: 2016-04-21 | Disposition: A | Discharge status: Home / Self Care | Payer: Commercial Managed Care - HMO | Attending: Emergency Medicine | Admitting: Emergency Medicine

## 2016-04-21 DIAGNOSIS — R079 Chest pain, unspecified: Secondary | ICD-10-CM

## 2016-04-21 LAB — I-STAT TROPONIN, ED: Troponin i, poc: 0 ng/mL (ref 0.00–0.08)

## 2016-04-21 MED ORDER — ASPIRIN 81 MG PO CHEW
324.0000 mg | CHEWABLE_TABLET | Freq: Once | ORAL | Status: AC
  Administered 2016-04-21: 324 mg via ORAL
  Filled 2016-04-21: qty 4

## 2016-04-21 MED ORDER — KETOROLAC TROMETHAMINE 30 MG/ML IJ SOLN: 30.0000 mg | Freq: Once | INTRAMUSCULAR | Status: DC
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
## 2016-06-19 ENCOUNTER — Encounter: Payer: Self-pay | Admitting: Gastroenterology

## 2016-06-21 ENCOUNTER — Ambulatory Visit (INDEPENDENT_AMBULATORY_CARE_PROVIDER_SITE_OTHER): Payer: Commercial Managed Care - HMO | Admitting: Internal Medicine

## 2016-06-21 ENCOUNTER — Encounter (INDEPENDENT_AMBULATORY_CARE_PROVIDER_SITE_OTHER): Payer: Self-pay

## 2016-06-21 ENCOUNTER — Encounter: Payer: Self-pay | Admitting: Internal Medicine

## 2016-06-21 VITALS — BP 128/84 | HR 78 | Ht 63.0 in | Wt 203.2 lb

## 2016-06-21 DIAGNOSIS — R1031 Right lower quadrant pain: Secondary | ICD-10-CM | POA: Diagnosis not present

## 2016-06-21 DIAGNOSIS — R1032 Left lower quadrant pain: Principal | ICD-10-CM

## 2016-06-21 DIAGNOSIS — R140 Abdominal distension (gaseous): Secondary | ICD-10-CM | POA: Diagnosis not present

## 2016-06-21 DIAGNOSIS — Z8619 Personal history of other infectious and parasitic diseases: Secondary | ICD-10-CM

## 2016-06-21 DIAGNOSIS — R1033 Periumbilical pain: Secondary | ICD-10-CM

## 2016-06-21 DIAGNOSIS — G8929 Other chronic pain: Secondary | ICD-10-CM

## 2016-06-21 MED ORDER — HYOSCYAMINE SULFATE 0.125 MG SL SUBL: 0.1250 mg | SUBLINGUAL_TABLET | SUBLINGUAL | 0 refills | Status: DC | PRN
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
## 2016-06-21 NOTE — Patient Instructions (Signed)
  Your physician has requested that you go to the basement for the following lab work before leaving today: Stool study   We have sent the following medications to your pharmacy for you to pick up at your convenience: Levsin SL for pain     I appreciate the opportunity to care for you. Stan Headarl Gessner, MD, [facility]

## 2016-06-22 DIAGNOSIS — R1032 Left lower quadrant pain: Principal | ICD-10-CM

## 2016-06-22 DIAGNOSIS — G8929 Other chronic pain: Secondary | ICD-10-CM | POA: Insufficient documentation

## 2016-07-03 ENCOUNTER — Other Ambulatory Visit: Payer: Commercial Managed Care - HMO

## 2016-07-03 DIAGNOSIS — G8929 Other chronic pain: Secondary | ICD-10-CM

## 2016-07-03 DIAGNOSIS — R1033 Periumbilical pain: Secondary | ICD-10-CM

## 2016-07-03 DIAGNOSIS — Z8619 Personal history of other infectious and parasitic diseases: Secondary | ICD-10-CM

## 2016-07-03 DIAGNOSIS — R1032 Left lower quadrant pain: Principal | ICD-10-CM

## 2016-07-05 LAB — H. PYLORI ANTIGEN, STOOL: H pylori Ag, Stl: NEGATIVE

## 2016-07-12 NOTE — Progress Notes (Signed)
H pylori is gone - prior tx Is hyoscyamine helping her pain adequately (any?)

## 2016-08-06 ENCOUNTER — Encounter (HOSPITAL_COMMUNITY): Payer: Self-pay | Admitting: Emergency Medicine

## 2016-08-06 ENCOUNTER — Ambulatory Visit (HOSPITAL_COMMUNITY)
Admission: EM | Admit: 2016-08-06 | Discharge: 2016-08-06 | Disposition: A | Discharge status: Home / Self Care | Payer: Commercial Managed Care - HMO | Attending: Emergency Medicine | Admitting: Emergency Medicine

## 2016-08-06 DIAGNOSIS — R0789 Other chest pain: Secondary | ICD-10-CM | POA: Diagnosis not present

## 2016-08-06 DIAGNOSIS — I10 Essential (primary) hypertension: Secondary | ICD-10-CM

## 2016-08-06 DIAGNOSIS — R350 Frequency of micturition: Secondary | ICD-10-CM

## 2016-08-06 DIAGNOSIS — J069 Acute upper respiratory infection, unspecified: Secondary | ICD-10-CM

## 2016-08-06 DIAGNOSIS — R81 Glycosuria: Secondary | ICD-10-CM | POA: Diagnosis not present

## 2016-08-06 DIAGNOSIS — R0602 Shortness of breath: Secondary | ICD-10-CM

## 2016-08-06 DIAGNOSIS — B9789 Other viral agents as the cause of diseases classified elsewhere: Secondary | ICD-10-CM

## 2016-08-06 LAB — POCT URINALYSIS DIP (DEVICE)
BILIRUBIN URINE: NEGATIVE
Glucose, UA: 500 mg/dL — AB
Leukocytes, UA: NEGATIVE
Nitrite: NEGATIVE
PH: 6 (ref 5.0–8.0)
Protein, ur: NEGATIVE mg/dL
Specific Gravity, Urine: 1.025 (ref 1.005–1.030)
Urobilinogen, UA: 0.2 mg/dL (ref 0.0–1.0)

## 2016-08-06 NOTE — ED Notes (Signed)
HANDED EKG TO Hayden RasmussenAVID MABE, NP
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
## 2016-08-06 NOTE — ED Triage Notes (Signed)
Patient complains of headache, facial pain for one week.  Reports stuffy nose.  Upper back and chest soreness.  Poor appetite.  Low back pain and urinating frequently.  Patient is also having loose stools.  Reports son has been sick with [place]

## 2016-08-06 NOTE — Discharge Instructions (Signed)
Among the many symptoms in which you are currently having the most worrisome is the chest pressure associated with fatigue, back pain and shortness of breath. This could represent a problem with your heart or lungs. This certainly could be serious. In addition you have upper respiratory infection symptoms and abdominal pain of uncertain cause. It is strongly recommended that she go to the emergency department now for evaluation of a potentially serious problem.
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
## 2016-10-19 ENCOUNTER — Emergency Department (HOSPITAL_COMMUNITY)
Admission: EM | Admit: 2016-10-19 | Discharge: 2016-10-20 | Disposition: A | Discharge status: Home / Self Care | Payer: Commercial Managed Care - HMO | Attending: Emergency Medicine | Admitting: Emergency Medicine

## 2016-10-19 ENCOUNTER — Encounter (HOSPITAL_COMMUNITY): Payer: Self-pay

## 2016-10-19 DIAGNOSIS — R51 Headache: Secondary | ICD-10-CM | POA: Diagnosis not present

## 2016-10-19 DIAGNOSIS — B9689 Other specified bacterial agents as the cause of diseases classified elsewhere: Secondary | ICD-10-CM | POA: Insufficient documentation

## 2016-10-19 DIAGNOSIS — Z79899 Other long term (current) drug therapy: Secondary | ICD-10-CM | POA: Diagnosis not present

## 2016-10-19 DIAGNOSIS — R519 Headache, unspecified: Secondary | ICD-10-CM

## 2016-10-19 DIAGNOSIS — N760 Acute vaginitis: Secondary | ICD-10-CM | POA: Insufficient documentation

## 2016-10-19 DIAGNOSIS — I10 Essential (primary) hypertension: Secondary | ICD-10-CM | POA: Insufficient documentation

## 2016-10-19 LAB — COMPREHENSIVE METABOLIC PANEL
ALT: 22 U/L (ref 14–54)
AST: 19 U/L (ref 15–41)
Albumin: 4.6 g/dL (ref 3.5–5.0)
Alkaline Phosphatase: 76 U/L (ref 38–126)
Anion gap: 8 (ref 5–15)
BILIRUBIN TOTAL: 0.8 mg/dL (ref 0.3–1.2)
BUN: 11 mg/dL (ref 6–20)
CALCIUM: 9.4 mg/dL (ref 8.9–10.3)
CO2: 27 mmol/L (ref 22–32)
CREATININE: 0.6 mg/dL (ref 0.44–1.00)
Chloride: 103 mmol/L (ref 101–111)
GFR calc Af Amer: >60 mL/min (ref >60)
Glucose, Bld: 101 mg/dL — ABNORMAL HIGH (ref 65–99)
POTASSIUM: 3.8 mmol/L (ref 3.5–5.1)
Sodium: 138 mmol/L (ref 135–145)
Total Protein: 8.6 g/dL — ABNORMAL HIGH (ref 6.5–8.1)

## 2016-10-19 LAB — CBC
HCT: 40.4 % (ref 36.0–46.0)
Hemoglobin: 13.3 g/dL (ref 12.0–15.0)
MCH: 25.3 pg — ABNORMAL LOW (ref 26.0–34.0)
MCHC: 32.9 g/dL (ref 30.0–36.0)
MCV: 76.8 fL — ABNORMAL LOW (ref 78.0–100.0)
PLATELETS: 306 10*3/uL (ref 150–400)
RBC: 5.26 MIL/uL — ABNORMAL HIGH (ref 3.87–5.11)
RDW: 15.2 % (ref 11.5–15.5)
WBC: 5.4 10*3/uL (ref 4.0–10.5)

## 2016-10-19 LAB — I-STAT BETA HCG BLOOD, ED (MC, WL, AP ONLY)

## 2016-10-19 LAB — LIPASE, BLOOD: Lipase: 23 U/L (ref 11–51)

## 2016-10-19 NOTE — ED Triage Notes (Addendum)
Patient reports that she has been having intermittent headaches,x 2 months, abdominal pain, blurred vision, and nausea x 2 weeks. Patient reports that she ashe has a history of migraine.  Patient at the end of triage reported white vaginal discharge x 2 months.

## 2016-10-20 ENCOUNTER — Emergency Department (HOSPITAL_COMMUNITY): Payer: Commercial Managed Care - HMO

## 2016-10-20 LAB — WET PREP, GENITAL
SPERM: NONE SEEN
TRICH WET PREP: NONE SEEN
YEAST WET PREP: NONE SEEN

## 2016-10-20 LAB — URINALYSIS, ROUTINE W REFLEX MICROSCOPIC
BILIRUBIN URINE: NEGATIVE
Bacteria, UA: NONE SEEN
Glucose, UA: NEGATIVE mg/dL
Ketones, ur: 20 mg/dL — AB
Leukocytes, UA: NEGATIVE
Nitrite: NEGATIVE
PH: 5 (ref 5.0–8.0)
Protein, ur: NEGATIVE mg/dL
SPECIFIC GRAVITY, URINE: 1.021 (ref 1.005–1.030)

## 2016-10-20 MED ORDER — DIPHENHYDRAMINE HCL 50 MG/ML IJ SOLN
25.0000 mg | Freq: Once | INTRAMUSCULAR | Status: AC
  Administered 2016-10-20: 25 mg via INTRAVENOUS
  Filled 2016-10-20: qty 1

## 2016-10-20 MED ORDER — SODIUM CHLORIDE 0.9 % IV BOLUS (SEPSIS)
1000.0000 mL | Freq: Once | INTRAVENOUS | Status: AC
  Administered 2016-10-20: 1000 mL via INTRAVENOUS

## 2016-10-20 MED ORDER — METOCLOPRAMIDE HCL 5 MG/ML IJ SOLN
10.0000 mg | Freq: Once | INTRAMUSCULAR | Status: AC
  Administered 2016-10-20: 10 mg via INTRAVENOUS
  Filled 2016-10-20: qty 2

## 2016-10-20 MED ORDER — METRONIDAZOLE 500 MG PO TABS: 500.0000 mg | ORAL_TABLET | Freq: Two times a day (BID) | ORAL | 0 refills | Status: DC

## 2016-10-20 MED ORDER — METRONIDAZOLE 500 MG PO TABS
500.0000 mg | ORAL_TABLET | Freq: Once | ORAL | Status: AC
  Administered 2016-10-20: 500 mg via ORAL
  Filled 2016-10-20: qty 1

## 2016-10-20 NOTE — ED Notes (Signed)
In ct  
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
## 2016-10-23 LAB — GC/CHLAMYDIA PROBE AMP (~~LOC~~) NOT AT ARMC
CHLAMYDIA, DNA PROBE: NEGATIVE
Neisseria Gonorrhea: NEGATIVE

## 2016-11-29 ENCOUNTER — Ambulatory Visit (INDEPENDENT_AMBULATORY_CARE_PROVIDER_SITE_OTHER): Payer: Commercial Managed Care - HMO | Admitting: Obstetrics and Gynecology

## 2016-11-29 ENCOUNTER — Telehealth: Payer: Self-pay | Admitting: Obstetrics and Gynecology

## 2016-11-29 ENCOUNTER — Encounter: Payer: Self-pay | Admitting: Obstetrics and Gynecology

## 2016-11-29 VITALS — BP 112/88 | HR 72 | Resp 16 | Ht 64.0 in | Wt 199.0 lb

## 2016-11-29 DIAGNOSIS — N921 Excessive and frequent menstruation with irregular cycle: Secondary | ICD-10-CM

## 2016-11-29 DIAGNOSIS — Z01419 Encounter for gynecological examination (general) (routine) without abnormal findings: Secondary | ICD-10-CM | POA: Diagnosis not present

## 2016-11-29 DIAGNOSIS — R7303 Prediabetes: Secondary | ICD-10-CM

## 2016-11-29 DIAGNOSIS — Z0000 Encounter for general adult medical examination without abnormal findings: Secondary | ICD-10-CM

## 2016-11-29 DIAGNOSIS — N926 Irregular menstruation, unspecified: Secondary | ICD-10-CM

## 2016-11-29 DIAGNOSIS — E559 Vitamin D deficiency, unspecified: Secondary | ICD-10-CM | POA: Diagnosis not present

## 2016-11-29 DIAGNOSIS — N946 Dysmenorrhea, unspecified: Secondary | ICD-10-CM

## 2016-11-29 DIAGNOSIS — R1012 Left upper quadrant pain: Secondary | ICD-10-CM

## 2016-11-29 DIAGNOSIS — Z113 Encounter for screening for infections with a predominantly sexual mode of transmission: Secondary | ICD-10-CM

## 2016-11-29 LAB — CBC
HEMATOCRIT: 40.1 % (ref 35.0–45.0)
HEMOGLOBIN: 13.1 g/dL (ref 11.7–15.5)
MCH: 25.9 pg — ABNORMAL LOW (ref 27.0–33.0)
MCHC: 32.7 g/dL (ref 32.0–36.0)
MCV: 79.2 fL — ABNORMAL LOW (ref 80.0–100.0)
MPV: 9.7 fL (ref 7.5–12.5)
Platelets: 292 10*3/uL (ref 140–400)
RBC: 5.06 MIL/uL (ref 3.80–5.10)
RDW: 16.8 % — AB (ref 11.0–15.0)
WBC: 4.7 10*3/uL (ref 3.8–10.8)

## 2016-11-29 LAB — HEMOGLOBIN A1C
Hgb A1c MFr Bld: 6.2 % — ABNORMAL HIGH (ref <5.7)
Mean Plasma Glucose: 131 mg/dL

## 2016-11-29 LAB — COMPREHENSIVE METABOLIC PANEL
ALBUMIN: 4 g/dL (ref 3.6–5.1)
ALK PHOS: 76 U/L (ref 33–115)
ALT: 14 U/L (ref 6–29)
AST: 14 U/L (ref 10–30)
BILIRUBIN TOTAL: 0.5 mg/dL (ref 0.2–1.2)
BUN: 10 mg/dL (ref 7–25)
CALCIUM: 9.2 mg/dL (ref 8.6–10.2)
CO2: 23 mmol/L (ref 20–31)
Chloride: 108 mmol/L (ref 98–110)
Creat: 0.79 mg/dL (ref 0.50–1.10)
GLUCOSE: 96 mg/dL (ref 65–99)
Potassium: 4.5 mmol/L (ref 3.5–5.3)
Sodium: 139 mmol/L (ref 135–146)
Total Protein: 7.3 g/dL (ref 6.1–8.1)

## 2016-11-29 LAB — LIPID PANEL
CHOLESTEROL: 123 mg/dL (ref <200)
HDL: 32 mg/dL — ABNORMAL LOW (ref >50)
LDL Cholesterol: 78 mg/dL (ref <100)
Total CHOL/HDL Ratio: 3.8 Ratio (ref <5.0)
Triglycerides: 63 mg/dL (ref <150)
VLDL: 13 mg/dL (ref <30)

## 2016-11-29 LAB — POCT URINE PREGNANCY: Preg Test, Ur: NEGATIVE

## 2016-11-29 LAB — TSH: TSH: 2.43 m[IU]/L

## 2016-11-29 MED ORDER — NAPROXEN SODIUM 550 MG PO TABS: 550.0000 mg | ORAL_TABLET | Freq: Two times a day (BID) | ORAL | 2 refills | Status: DC
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
## 2016-11-29 NOTE — Patient Instructions (Signed)

## 2016-11-29 NOTE — Telephone Encounter (Signed)
Unable to get "hospitalization and multiple surgeries about 13 years ago. Complication from tubal ligation" records [facility]. I was informed that [place]

## 2016-11-30 ENCOUNTER — Telehealth: Payer: Self-pay | Admitting: Obstetrics and Gynecology

## 2016-11-30 LAB — STD PANEL
HIV 1&2 Ab, 4th Generation: NONREACTIVE
Hepatitis B Surface Ag: NEGATIVE

## 2016-11-30 LAB — VITAMIN D 25 HYDROXY (VIT D DEFICIENCY, FRACTURES): VIT D 25 HYDROXY: 21 ng/mL — AB (ref 30–100)

## 2016-11-30 LAB — GC/CHLAMYDIA PROBE AMP
CT Probe RNA: NOT DETECTED
GC PROBE AMP APTIMA: NOT DETECTED

## 2016-11-30 LAB — HEPATITIS C ANTIBODY: HCV AB: NEGATIVE

## 2016-11-30 LAB — PROLACTIN: Prolactin: 23.3 ng/mL

## 2016-11-30 NOTE — Telephone Encounter (Signed)
Called patient to review benefits for a recommended ultrasound. Left Voicemail requesting a call back. °

## 2016-11-30 NOTE — Telephone Encounter (Signed)
Patient returned call. Spoke with patient regarding benefit for ultrasound. Patient understood and agreeable. Patient ready to schedule. Patient scheduled 12/26/16 with Dr Oscar LaJertson. Earlier appointment dates were offered, but patient declined to schedule on an earlier date. Patient aware of date, arrival time and cancellation policy. No further questions.   Routing to Dr Oscar LaJertson for final review

## 2016-12-10 ENCOUNTER — Encounter (HOSPITAL_COMMUNITY): Payer: Self-pay | Admitting: Emergency Medicine

## 2016-12-10 ENCOUNTER — Emergency Department (HOSPITAL_COMMUNITY)
Admission: EM | Admit: 2016-12-10 | Discharge: 2016-12-10 | Disposition: A | Discharge status: Home / Self Care | Payer: Commercial Managed Care - HMO | Attending: Emergency Medicine | Admitting: Emergency Medicine

## 2016-12-10 ENCOUNTER — Emergency Department (HOSPITAL_COMMUNITY): Payer: Commercial Managed Care - HMO

## 2016-12-10 DIAGNOSIS — N63 Unspecified lump in unspecified breast: Secondary | ICD-10-CM

## 2016-12-10 DIAGNOSIS — N6310 Unspecified lump in the right breast, unspecified quadrant: Secondary | ICD-10-CM | POA: Diagnosis present

## 2016-12-10 DIAGNOSIS — R0981 Nasal congestion: Secondary | ICD-10-CM

## 2016-12-10 DIAGNOSIS — Z79899 Other long term (current) drug therapy: Secondary | ICD-10-CM | POA: Diagnosis not present

## 2016-12-10 DIAGNOSIS — I10 Essential (primary) hypertension: Secondary | ICD-10-CM | POA: Insufficient documentation

## 2016-12-10 LAB — BASIC METABOLIC PANEL
ANION GAP: 10 (ref 5–15)
BUN: 8 mg/dL (ref 6–20)
CALCIUM: 9.1 mg/dL (ref 8.9–10.3)
CHLORIDE: 106 mmol/L (ref 101–111)
CO2: 22 mmol/L (ref 22–32)
Creatinine, Ser: 0.71 mg/dL (ref 0.44–1.00)
GFR calc non Af Amer: >60 mL/min (ref >60)
Glucose, Bld: 115 mg/dL — ABNORMAL HIGH (ref 65–99)
POTASSIUM: 4 mmol/L (ref 3.5–5.1)
Sodium: 138 mmol/L (ref 135–145)

## 2016-12-10 LAB — CBC
HCT: 40.1 % (ref 36.0–46.0)
HEMOGLOBIN: 13 g/dL (ref 12.0–15.0)
MCH: 25.8 pg — AB (ref 26.0–34.0)
MCHC: 32.4 g/dL (ref 30.0–36.0)
MCV: 79.6 fL (ref 78.0–100.0)
Platelets: 328 10*3/uL (ref 150–400)
RBC: 5.04 MIL/uL (ref 3.87–5.11)
RDW: 15.5 % (ref 11.5–15.5)
WBC: 4.6 10*3/uL (ref 4.0–10.5)

## 2016-12-10 LAB — URINALYSIS, ROUTINE W REFLEX MICROSCOPIC
BACTERIA UA: NONE SEEN
BILIRUBIN URINE: NEGATIVE
Glucose, UA: NEGATIVE mg/dL
Ketones, ur: NEGATIVE mg/dL
LEUKOCYTES UA: NEGATIVE
NITRITE: NEGATIVE
PH: 5 (ref 5.0–8.0)
Protein, ur: NEGATIVE mg/dL
SPECIFIC GRAVITY, URINE: 1.02 (ref 1.005–1.030)

## 2016-12-10 LAB — I-STAT TROPONIN, ED: TROPONIN I, POC: 0 ng/mL (ref 0.00–0.08)

## 2016-12-10 MED ORDER — FEXOFENADINE HCL 60 MG PO TABS: 60.0000 mg | ORAL_TABLET | Freq: Two times a day (BID) | ORAL | 0 refills | Status: DC

## 2016-12-10 MED ORDER — FLUTICASONE PROPIONATE 50 MCG/ACT NA SUSP: 2.0000 | Freq: Every day | NASAL | 0 refills | Status: DC
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
## 2016-12-10 NOTE — ED Notes (Signed)
No answer to re take VS

## 2016-12-10 NOTE — ED Triage Notes (Signed)
Pt here with multiple c/os, lump in right breast "close to chest wall", abd pain-- suprapubic, then states "it hurts up higher too" C/o chest tightness in chest and back-- "I have been coming here for the same thing for years"  Pt having additional c/o's-- back, chest, abd, breast,

## 2016-12-14 ENCOUNTER — Ambulatory Visit: Payer: Commercial Managed Care - HMO | Admitting: Obstetrics and Gynecology

## 2016-12-15 ENCOUNTER — Other Ambulatory Visit: Payer: Self-pay | Admitting: Obstetrics and Gynecology

## 2016-12-15 ENCOUNTER — Telehealth: Payer: Self-pay | Admitting: Obstetrics and Gynecology

## 2016-12-15 DIAGNOSIS — N6310 Unspecified lump in the right breast, unspecified quadrant: Secondary | ICD-10-CM

## 2016-12-15 DIAGNOSIS — Z1231 Encounter for screening mammogram for malignant neoplasm of breast: Secondary | ICD-10-CM

## 2016-12-15 NOTE — Telephone Encounter (Signed)
Patient has a lump in the right breast and need a referral to the breast center for an ultrasound and diagnostic mammogram.

## 2016-12-15 NOTE — Telephone Encounter (Addendum)
Patient was seen at the ED (note in EPIC) on 12/10/2016 has small hard 0.5cm lump about sternal/costal rim in the right breast. Reviewed with Dr.Jertson who recommends the patient be seen for bilateral diagnostic imaging with right breast ultrasound and return for 6 week follow up appointment for evaluation in office with her. Spoke with patient. Advised of recommendations from Dr.Jertson. Patient is agreeable. 6 week breast recheck with Dr.Jertson scheduled for 01/23/2017 at 8 am. Patient is agreeable to date and time. Patient requests RN call to schedule breast imaging and return call with appointment date and time. Advised RN will call at this time to schedule appointment and return call. Patient is agreeable.  Call to the Breast Center. Appointment for bilateral diagnostic mammogram with right breast ultrasound scheduled for 12/19/2016 at 8:40 am with 8:20 am arrival. Patient placed in mammogram hold.  Left detailed message at number provided [phone], okay per ROI. Advised of appointment date and time with the Breast Center as seen above. Advised patient may contact the Breast Center if she needs to reschedule this appointment. May also contact our office with any further questions.  Routing to provider for final review. Patient agreeable to disposition. Will close encounter.

## 2016-12-19 ENCOUNTER — Other Ambulatory Visit: Payer: Commercial Managed Care - HMO

## 2016-12-21 ENCOUNTER — Telehealth: Payer: Self-pay | Admitting: Obstetrics and Gynecology

## 2016-12-21 DIAGNOSIS — N921 Excessive and frequent menstruation with irregular cycle: Secondary | ICD-10-CM

## 2016-12-21 DIAGNOSIS — N946 Dysmenorrhea, unspecified: Secondary | ICD-10-CM

## 2016-12-21 NOTE — Telephone Encounter (Signed)
Patient called to cancel ultrasound scheduled for 12/26/16 with Dr Oscar LaJertson.  Patient declined to reschedule over the phone.  Patient advises she will call back to reschedule.  Routing to Dr Oscar LaJertson  cc: Billie RuddySally Yeakley

## 2016-12-26 ENCOUNTER — Other Ambulatory Visit: Payer: Commercial Managed Care - HMO

## 2016-12-26 ENCOUNTER — Other Ambulatory Visit: Payer: Commercial Managed Care - HMO | Admitting: Obstetrics and Gynecology

## 2016-12-27 NOTE — Telephone Encounter (Signed)
Call to patient to follow up on rescheduling ultrasound. Appointment originally scheduled for 12-26-16 with Dr. Oscar LaJertson and cancelled on 12-21-16. Left voicemail to return call.

## 2017-01-08 NOTE — Telephone Encounter (Signed)
Patient cancelled her 6 week breast reck because she does not know if she can get off work that day. Did not wish to reschedule.

## 2017-01-08 NOTE — Telephone Encounter (Signed)
The patient had a breast lump noted in the ER, she was supposed to have diagnostic breast imaging and then a f/u breast check here in 6 weeks. It looks like she is scheduled for the imaging in a few more weeks (not sure why so far out).  After her imaging we should have her come in for a f/u exam as long as she isn't sent to a breast surgeon.  She was also going to have a gyn ultrasound for c/o menorrhagia and dysmenorrhea. Not anemic. We need to see if she is still planning to have that ultrasound.

## 2017-01-09 ENCOUNTER — Ambulatory Visit: Payer: Commercial Managed Care - HMO | Admitting: Family Medicine

## 2017-01-09 NOTE — Telephone Encounter (Signed)
Patient returning call.

## 2017-01-09 NOTE — Telephone Encounter (Signed)
Left message to call Breanna Daniels at [phone].  

## 2017-01-09 NOTE — Telephone Encounter (Signed)
Spoke with patient, advised as seen below per Dr. Oscar La. Patient states she is scheduled for breast imaging on 4/18, would like to schedule PUS on 5/1 if available d/t work schedule. Patient scheduled for PUS and breast recheck on 5/1 at 12:30pm with consult to follow at 1pm. Patient verbalizes understanding and is agreeable to date and time.  Routing to provider for final review. Patient is agreeable to disposition. Will close encounter.   ZO:XWRU [employer]

## 2017-01-11 ENCOUNTER — Telehealth: Payer: Self-pay | Admitting: Obstetrics and Gynecology

## 2017-01-11 NOTE — Telephone Encounter (Signed)
Patient returned call. Reviewed benefit for ultrasound. Patient understood and agreeable. Patient is scheduled 02/06/17 with Dr Oscar La. Patient aware of date, arrival time and cancellation policy. No further questions. Ok to close

## 2017-01-11 NOTE — Telephone Encounter (Signed)
Called patient to review benefits for a recommended ultrasound. Left Voicemail requesting a call back. °

## 2017-01-23 ENCOUNTER — Ambulatory Visit: Payer: Commercial Managed Care - HMO | Admitting: Obstetrics and Gynecology

## 2017-01-24 ENCOUNTER — Ambulatory Visit
Admission: RE | Admit: 2017-01-24 | Discharge: 2017-01-24 | Disposition: A | Discharge status: Home / Self Care | Payer: Commercial Managed Care - HMO | Source: Ambulatory Visit | Attending: Obstetrics and Gynecology | Admitting: Obstetrics and Gynecology

## 2017-01-24 ENCOUNTER — Other Ambulatory Visit: Payer: Self-pay | Admitting: Obstetrics and Gynecology

## 2017-01-24 DIAGNOSIS — N6310 Unspecified lump in the right breast, unspecified quadrant: Secondary | ICD-10-CM

## 2017-01-24 DIAGNOSIS — N6320 Unspecified lump in the left breast, unspecified quadrant: Secondary | ICD-10-CM

## 2017-01-26 ENCOUNTER — Telehealth: Payer: Self-pay | Admitting: Obstetrics and Gynecology

## 2017-01-26 NOTE — Telephone Encounter (Signed)
Spoke with patient. Patient states she had MMG and US done on 01/24/17 at [facility] and was advised nothing of concern. Patient asking if Dr. Oscar La has had a chance to review imaging? Advised patient Dr. Oscar La is out of the office today, can review when she returns on 4/23 and return call, patient is agreeable. Patient states she is concerned that nothing was found on breast exam, as she reports having "chest wall pain" for the past 2-3 years that is getting worse. Patient states she has been evaluated by ER providers and pcp and advised it is nothing. Patient states she believes there is a cyst sitting in nerve causing the pain, hurting in back and chest. Recommended f/u with pcp provider for further evaluation, patient states hard to get an appointment and I have already been for the same thing with no resolve. Advised patient to seek care at local ER or Urgent care for evaluation. Advised patient would review with covering provider and return call with any additional recommendations, patient is agreeable.   Dr. Edward Jolly -any additional recommendations?  Cc: Dr. Oscar La

## 2017-01-26 NOTE — Telephone Encounter (Signed)
Dr. Jertson, please advise. 

## 2017-01-26 NOTE — Telephone Encounter (Signed)
It is best for Dr. Oscar La review her care and make recommendations.  She knows her care and history best.

## 2017-01-26 NOTE — Telephone Encounter (Signed)
Patient had mammogram done on Wednesday and checking to see if results have been sent to Dr Oscar La.

## 2017-01-27 NOTE — Telephone Encounter (Signed)
If you review my results note from 01/24/17, the mammogram was reviewed that day and it was normal. The patient has a visit scheduled for next month (for a GYN ultrasound). I was planning on doing a breast exam that day. I have not myself accessed her for this problem. I can see her in the coming week if she would like.

## 2017-01-29 NOTE — Telephone Encounter (Signed)
Left message to call Odarius Dines at [phone].  

## 2017-01-29 NOTE — Telephone Encounter (Signed)
Spoke with patient, advised as seen below per Dr. Oscar La. Patient declined to schedule OV at this time, will plan to have breast exam on 02/06/17 ultrasound appointment. Advised patient to return call to office if any additional questions/concerns. Patient verbalizes understanding and is agreeable.  Routing to provider for final review. Patient is agreeable to disposition. Will close encounter.

## 2017-02-06 ENCOUNTER — Encounter: Payer: Self-pay | Admitting: Obstetrics and Gynecology

## 2017-02-06 ENCOUNTER — Ambulatory Visit (INDEPENDENT_AMBULATORY_CARE_PROVIDER_SITE_OTHER): Payer: Commercial Managed Care - HMO | Admitting: Obstetrics and Gynecology

## 2017-02-06 ENCOUNTER — Telehealth: Payer: Self-pay | Admitting: Obstetrics and Gynecology

## 2017-02-06 ENCOUNTER — Ambulatory Visit (INDEPENDENT_AMBULATORY_CARE_PROVIDER_SITE_OTHER): Payer: Commercial Managed Care - HMO

## 2017-02-06 VITALS — BP 140/90 | HR 80 | Resp 16 | Wt 201.0 lb

## 2017-02-06 DIAGNOSIS — N946 Dysmenorrhea, unspecified: Secondary | ICD-10-CM

## 2017-02-06 DIAGNOSIS — N920 Excessive and frequent menstruation with regular cycle: Secondary | ICD-10-CM

## 2017-02-06 DIAGNOSIS — N921 Excessive and frequent menstruation with irregular cycle: Secondary | ICD-10-CM

## 2017-02-06 DIAGNOSIS — N6320 Unspecified lump in the left breast, unspecified quadrant: Secondary | ICD-10-CM | POA: Diagnosis not present
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
## 2017-02-06 NOTE — Telephone Encounter (Signed)
Patient did not schedule her 2 month breast check at check out today. Patient stated she would ned to call later to schedule this appointment.
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
## 2017-02-06 NOTE — Telephone Encounter (Signed)
Okay, thank you. Please let the patient know and close the encounter.  Thank you!!

## 2017-02-07 ENCOUNTER — Ambulatory Visit (INDEPENDENT_AMBULATORY_CARE_PROVIDER_SITE_OTHER): Payer: Commercial Managed Care - HMO | Admitting: Family Medicine

## 2017-02-07 ENCOUNTER — Encounter: Payer: Self-pay | Admitting: Family Medicine

## 2017-02-07 VITALS — BP 134/86 | HR 70 | Temp 98.3°F | Ht 64.0 in | Wt 200.6 lb

## 2017-02-07 DIAGNOSIS — N644 Mastodynia: Secondary | ICD-10-CM

## 2017-02-07 DIAGNOSIS — M62830 Muscle spasm of back: Secondary | ICD-10-CM

## 2017-02-07 DIAGNOSIS — M62838 Other muscle spasm: Secondary | ICD-10-CM | POA: Diagnosis not present

## 2017-02-07 MED ORDER — MELOXICAM 15 MG PO TABS: 15.0000 mg | ORAL_TABLET | Freq: Every day | ORAL | 0 refills | Status: DC

## 2017-02-07 MED ORDER — CYCLOBENZAPRINE HCL 5 MG PO TABS: 5.0000 mg | ORAL_TABLET | Freq: Every day | ORAL | 0 refills | Status: DC
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
## 2017-02-27 ENCOUNTER — Emergency Department (HOSPITAL_COMMUNITY)
Admission: EM | Admit: 2017-02-27 | Discharge: 2017-02-27 | Disposition: A | Discharge status: Home / Self Care | Payer: Commercial Managed Care - HMO | Attending: Emergency Medicine | Admitting: Emergency Medicine

## 2017-02-27 ENCOUNTER — Encounter (HOSPITAL_COMMUNITY): Payer: Self-pay | Admitting: Emergency Medicine

## 2017-02-27 ENCOUNTER — Emergency Department (HOSPITAL_COMMUNITY): Payer: Commercial Managed Care - HMO

## 2017-02-27 ENCOUNTER — Other Ambulatory Visit: Payer: Self-pay

## 2017-02-27 ENCOUNTER — Telehealth: Payer: Self-pay | Admitting: Family Medicine

## 2017-02-27 DIAGNOSIS — R1013 Epigastric pain: Secondary | ICD-10-CM | POA: Insufficient documentation

## 2017-02-27 DIAGNOSIS — Z79899 Other long term (current) drug therapy: Secondary | ICD-10-CM | POA: Diagnosis not present

## 2017-02-27 DIAGNOSIS — R110 Nausea: Secondary | ICD-10-CM | POA: Insufficient documentation

## 2017-02-27 DIAGNOSIS — I10 Essential (primary) hypertension: Secondary | ICD-10-CM | POA: Diagnosis not present

## 2017-02-27 LAB — CBC
HCT: 37.1 % (ref 36.0–46.0)
HEMOGLOBIN: 11.9 g/dL — AB (ref 12.0–15.0)
MCH: 25.5 pg — ABNORMAL LOW (ref 26.0–34.0)
MCHC: 32.1 g/dL (ref 30.0–36.0)
MCV: 79.4 fL (ref 78.0–100.0)
Platelets: 312 10*3/uL (ref 150–400)
RBC: 4.67 MIL/uL (ref 3.87–5.11)
RDW: 15 % (ref 11.5–15.5)
WBC: 4.9 10*3/uL (ref 4.0–10.5)

## 2017-02-27 LAB — LIPASE, BLOOD: Lipase: 25 U/L (ref 11–51)

## 2017-02-27 LAB — BASIC METABOLIC PANEL
ANION GAP: 10 (ref 5–15)
BUN: 6 mg/dL (ref 6–20)
CHLORIDE: 105 mmol/L (ref 101–111)
CO2: 22 mmol/L (ref 22–32)
Calcium: 8.5 mg/dL — ABNORMAL LOW (ref 8.9–10.3)
Creatinine, Ser: 0.73 mg/dL (ref 0.44–1.00)
GFR calc Af Amer: >60 mL/min (ref >60)
GLUCOSE: 139 mg/dL — AB (ref 65–99)
POTASSIUM: 3.5 mmol/L (ref 3.5–5.1)
SODIUM: 137 mmol/L (ref 135–145)

## 2017-02-27 LAB — I-STAT TROPONIN, ED: Troponin i, poc: 0 ng/mL (ref 0.00–0.08)

## 2017-02-27 LAB — CBG MONITORING, ED: Glucose-Capillary: 145 mg/dL — ABNORMAL HIGH (ref 65–99)

## 2017-02-27 MED ORDER — FAMOTIDINE 40 MG PO TABS: 40.0000 mg | ORAL_TABLET | Freq: Every day | ORAL | 0 refills | Status: DC

## 2017-02-27 MED ORDER — ONDANSETRON HCL 4 MG/2ML IJ SOLN
4.0000 mg | Freq: Once | INTRAMUSCULAR | Status: AC
  Administered 2017-02-27: 4 mg via INTRAVENOUS
  Filled 2017-02-27: qty 2

## 2017-02-27 MED ORDER — ONDANSETRON HCL 4 MG PO TABS: 4.0000 mg | ORAL_TABLET | Freq: Three times a day (TID) | ORAL | 0 refills | Status: AC | PRN

## 2017-02-27 MED ORDER — SUCRALFATE 1 G PO TABS: 1.0000 g | ORAL_TABLET | Freq: Three times a day (TID) | ORAL | 0 refills | Status: DC

## 2017-02-27 MED ORDER — SODIUM CHLORIDE 0.9 % IV BOLUS (SEPSIS)
1000.0000 mL | Freq: Once | INTRAVENOUS | Status: AC
  Administered 2017-02-27: 1000 mL via INTRAVENOUS

## 2017-02-27 NOTE — Discharge Instructions (Signed)
Take medications as prescribed. Follow-up with your primary care for reevaluation of your blood pressure and abdominal pain. Return to the ED if any concerning symptoms develop.
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
## 2017-02-27 NOTE — Telephone Encounter (Signed)
Called patient back to advise what Dr.Wallace said. Left message for patient to go to the ED. Also left message for patient to call back to let us know she got message.

## 2017-02-27 NOTE — Telephone Encounter (Signed)
Spoke with patient about message. She started having dizziness, shortness of breath and nausea this AM. Patient denies any chest pain, arm weakness, or back pain. She stated that when she sits down the dizziness gets better. The shortness of breath is the same whatever she does. Patient stated that she has not been drinking as much water the last couple of days. Patient is willing to go to the ED if she needs to. Per Dr. Earlene PlaterWallace patient needs to go to the ED.

## 2017-02-27 NOTE — ED Triage Notes (Signed)
Per pt, states she woke up this am feeling nauseated and dizzy-states no pain, no vomiting

## 2017-02-27 NOTE — ED Notes (Signed)
Pt had total of 4 bloody bowel movement here in ED and pt state she had 2 bloody bowel movement at home.

## 2017-02-27 NOTE — Telephone Encounter (Signed)
Patient lifted weights yesterday and woke up this morning feeling dizzy and nauseous, this is out of the ordinary for the patient. Please call patient to advise this morning. Okay to leave detailed message on patient's phone.

## 2017-02-28 ENCOUNTER — Encounter: Payer: Commercial Managed Care - HMO | Admitting: Family Medicine

## 2017-02-28 DIAGNOSIS — Z0289 Encounter for other administrative examinations: Secondary | ICD-10-CM

## 2017-05-10 ENCOUNTER — Ambulatory Visit: Payer: Commercial Managed Care - HMO | Admitting: Family Medicine

## 2017-05-17 ENCOUNTER — Ambulatory Visit (INDEPENDENT_AMBULATORY_CARE_PROVIDER_SITE_OTHER): Payer: Commercial Managed Care - HMO | Admitting: Family Medicine

## 2017-05-17 ENCOUNTER — Encounter: Payer: Self-pay | Admitting: Family Medicine

## 2017-05-17 VITALS — BP 132/88 | HR 77 | Temp 98.2°F | Ht 64.0 in | Wt 202.2 lb

## 2017-05-17 DIAGNOSIS — K219 Gastro-esophageal reflux disease without esophagitis: Secondary | ICD-10-CM | POA: Diagnosis not present

## 2017-05-17 DIAGNOSIS — R0789 Other chest pain: Secondary | ICD-10-CM | POA: Diagnosis not present

## 2017-05-17 DIAGNOSIS — M62830 Muscle spasm of back: Secondary | ICD-10-CM | POA: Diagnosis not present

## 2017-05-17 DIAGNOSIS — M62838 Other muscle spasm: Secondary | ICD-10-CM

## 2017-05-17 MED ORDER — CYCLOBENZAPRINE HCL 5 MG PO TABS: 5.0000 mg | ORAL_TABLET | Freq: Every day | ORAL | 0 refills | Status: DC

## 2017-05-17 MED ORDER — OMEPRAZOLE 40 MG PO CPDR: 40.0000 mg | DELAYED_RELEASE_CAPSULE | Freq: Every day | ORAL | 3 refills | Status: DC
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
## 2017-07-16 ENCOUNTER — Encounter (HOSPITAL_COMMUNITY): Payer: Self-pay | Admitting: Emergency Medicine

## 2017-07-16 ENCOUNTER — Emergency Department (HOSPITAL_COMMUNITY)
Admission: EM | Admit: 2017-07-16 | Discharge: 2017-07-16 | Disposition: A | Discharge status: Home / Self Care | Payer: 59 | Attending: Emergency Medicine | Admitting: Emergency Medicine

## 2017-07-16 ENCOUNTER — Emergency Department (HOSPITAL_COMMUNITY): Payer: 59

## 2017-07-16 DIAGNOSIS — S161XXA Strain of muscle, fascia and tendon at neck level, initial encounter: Secondary | ICD-10-CM | POA: Diagnosis not present

## 2017-07-16 DIAGNOSIS — I10 Essential (primary) hypertension: Secondary | ICD-10-CM | POA: Insufficient documentation

## 2017-07-16 DIAGNOSIS — Y929 Unspecified place or not applicable: Secondary | ICD-10-CM | POA: Insufficient documentation

## 2017-07-16 DIAGNOSIS — I252 Old myocardial infarction: Secondary | ICD-10-CM | POA: Diagnosis not present

## 2017-07-16 DIAGNOSIS — Y999 Unspecified external cause status: Secondary | ICD-10-CM | POA: Insufficient documentation

## 2017-07-16 DIAGNOSIS — Z79899 Other long term (current) drug therapy: Secondary | ICD-10-CM | POA: Diagnosis not present

## 2017-07-16 DIAGNOSIS — Y939 Activity, unspecified: Secondary | ICD-10-CM | POA: Insufficient documentation

## 2017-07-16 DIAGNOSIS — S199XXA Unspecified injury of neck, initial encounter: Secondary | ICD-10-CM | POA: Diagnosis present

## 2017-07-16 MED ORDER — IBUPROFEN 600 MG PO TABS: 600.0000 mg | ORAL_TABLET | Freq: Four times a day (QID) | ORAL | 0 refills | Status: DC | PRN

## 2017-07-16 MED ORDER — CYCLOBENZAPRINE HCL 5 MG PO TABS: 5.0000 mg | ORAL_TABLET | Freq: Three times a day (TID) | ORAL | 0 refills | Status: DC | PRN

## 2017-07-16 NOTE — Discharge Instructions (Signed)
It was my pleasure taking care of you today!   Ibuprofen as needed for pain. Flexeril is your muscle relaxer to take as needed.  Ice or heat to the affected area for additional pain relief.   Please follow up with your primary care provider for further discussion of today's diagnosis and management of your symptoms.   Return to ER for new or worsening symptoms, any additional concerns.
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
## 2017-07-16 NOTE — ED Notes (Signed)
Bed: WTR7 Expected date:  Expected time:  Means of arrival:  Comments: 
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
## 2017-07-27 ENCOUNTER — Encounter: Payer: Self-pay | Admitting: Sports Medicine

## 2017-07-27 ENCOUNTER — Ambulatory Visit (INDEPENDENT_AMBULATORY_CARE_PROVIDER_SITE_OTHER): Payer: 59 | Admitting: Sports Medicine

## 2017-07-27 VITALS — BP 132/88 | HR 76 | Ht 63.0 in | Wt 201.4 lb

## 2017-07-27 DIAGNOSIS — M47812 Spondylosis without myelopathy or radiculopathy, cervical region: Secondary | ICD-10-CM | POA: Diagnosis not present

## 2017-07-27 DIAGNOSIS — G43009 Migraine without aura, not intractable, without status migrainosus: Secondary | ICD-10-CM

## 2017-07-27 DIAGNOSIS — M7918 Myalgia, other site: Secondary | ICD-10-CM

## 2017-07-27 MED ORDER — DULOXETINE HCL 30 MG PO CPEP: 30.0000 mg | ORAL_CAPSULE | Freq: Every day | ORAL | 3 refills | Status: DC

## 2017-07-27 NOTE — Assessment & Plan Note (Signed)
Referral to physical therapy, start Cymbalta Follow-up 6 weeks
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
## 2017-07-27 NOTE — Patient Instructions (Signed)

## 2017-08-13 ENCOUNTER — Ambulatory Visit (INDEPENDENT_AMBULATORY_CARE_PROVIDER_SITE_OTHER): Payer: 59 | Admitting: Physical Therapy

## 2017-08-13 ENCOUNTER — Encounter: Payer: Self-pay | Admitting: Physical Therapy

## 2017-08-13 DIAGNOSIS — M545 Low back pain: Secondary | ICD-10-CM | POA: Diagnosis not present

## 2017-08-13 DIAGNOSIS — G8929 Other chronic pain: Secondary | ICD-10-CM

## 2017-08-13 DIAGNOSIS — R293 Abnormal posture: Secondary | ICD-10-CM

## 2017-08-13 DIAGNOSIS — M542 Cervicalgia: Secondary | ICD-10-CM

## 2017-08-13 NOTE — Patient Instructions (Signed)
Flexibility: Upper Trapezius Stretch    Gently grasp right side of head while reaching behind back with other hand. Tilt head away until a gentle stretch is felt. Hold __30__ seconds.  Repeat to other side. Repeat __3__ times per set. Do __1__ sets per session. Do _2-3___ sessions per day.  Levator Scapula Stretch, Sitting    Sit, one hand tucked under hip on side to be stretched, other hand over top of head. Turn head toward other side and look down. Use hand on head to gently stretch neck in that position. Hold __30_ seconds. Repeat to other side. Repeat _3__ times per session. Do _2-3__ sessions per day.  Hamstring Step 2    Left foot relaxed, knee straight, other leg bent, foot flat. Raise straight leg further upward to maximal range. Hold __30_ seconds. Relax leg completely down.  Repeat to other side. Repeat _3__ times.  Perform 2-3 sessions per day.    Piriformis Stretch    Lying on back, pull left knee toward opposite shoulder. Hold _30___ seconds.  Repeat with other leg. Repeat _3___ times. Do _2-3___ sessions per day.    Trigger Point Dry Needling  . What is Trigger Point Dry Needling (DN)? o DN is a physical therapy technique used to treat muscle pain and dysfunction. Specifically, DN helps deactivate muscle trigger points (muscle knots).  o A thin filiform needle is used to penetrate the skin and stimulate the underlying trigger point. The goal is for a local twitch response (LTR) to occur and for the trigger point to relax. No medication of any kind is injected during the procedure.   . What Does Trigger Point Dry Needling Feel Like?  o The procedure feels different for each individual patient. Some patients report that they do not actually feel the needle enter the skin and overall the process is not painful. Very mild bleeding may occur. However, many patients feel a deep cramping in the muscle in which the needle was inserted. This is the local twitch response.     Marland Kitchen. How Will I feel after the treatment? o Soreness is normal, and the onset of soreness may not occur for a few hours. Typically this soreness does not last longer than two days.  o Bruising is uncommon, however; ice can be used to decrease any possible bruising.  o In rare cases feeling tired or nauseous after the treatment is normal. In addition, your symptoms may get worse before they get better, this period will typically not last longer than 24 hours.   . What Can I do After My Treatment? o Increase your hydration by drinking more water for the next 24 hours. o You may place ice or heat on the areas treated that have become sore, however, do not use heat on inflamed or bruised areas. Heat often brings more relief post needling. o You can continue your regular activities, but vigorous activity is not recommended initially after the treatment for 24 hours. o DN is best combined with other physical therapy such as strengthening, stretching, and other therapies.
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
## 2017-09-06 ENCOUNTER — Encounter: Payer: Self-pay | Admitting: Physical Therapy

## 2017-09-06 ENCOUNTER — Ambulatory Visit (INDEPENDENT_AMBULATORY_CARE_PROVIDER_SITE_OTHER): Payer: 59 | Admitting: Physical Therapy

## 2017-09-06 DIAGNOSIS — M542 Cervicalgia: Secondary | ICD-10-CM

## 2017-09-06 DIAGNOSIS — R293 Abnormal posture: Secondary | ICD-10-CM

## 2017-09-06 DIAGNOSIS — M545 Low back pain, unspecified: Secondary | ICD-10-CM

## 2017-09-06 DIAGNOSIS — G8929 Other chronic pain: Secondary | ICD-10-CM
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
## 2017-09-07 ENCOUNTER — Ambulatory Visit: Payer: 59 | Admitting: Sports Medicine

## 2017-09-11 ENCOUNTER — Telehealth: Payer: Self-pay | Admitting: Obstetrics and Gynecology

## 2017-09-11 NOTE — Telephone Encounter (Signed)
Patient called complaining of a "possible UTI." She said she cannot come for a visit today because she has to work 10 hours and may be difficult to reach. She scheduled an appointment for 09/12/17 with Leota Sauerseborah Leonard, CNM at 9:30 AM. She will try to take a return call from the nurse. She is aware to call our office back or go to an urgent care if symptoms worsen prior to her appointment.

## 2017-09-11 NOTE — Telephone Encounter (Signed)
Spoke with patient. Patient states that she is having urinary symptoms and feels this is related to a UTI. Offered an appointment today for evaluation, but patient declines due to her work schedule. Advised if she develops any new or worsening symptoms will need to be seen today in the office or at a local Urgent Care for evaluation. Advised to drink plenty of water. Patient is scheduled to see Leota SauersDeborah Leonard CNM for evaluation on 09/12/2017 and will keep appointment.  Routing to provider for final review. Patient agreeable to disposition. Will close encounter.

## 2017-09-11 NOTE — Telephone Encounter (Signed)
Left message to call Kaitlyn at [phone]. 

## 2017-09-12 ENCOUNTER — Other Ambulatory Visit: Payer: Self-pay

## 2017-09-12 ENCOUNTER — Encounter: Payer: Self-pay | Admitting: Certified Nurse Midwife

## 2017-09-12 ENCOUNTER — Ambulatory Visit: Payer: Commercial Managed Care - HMO | Admitting: Certified Nurse Midwife

## 2017-09-12 VITALS — BP 114/70 | HR 70 | Temp 98.5°F | Resp 16 | Ht 64.0 in | Wt 198.0 lb

## 2017-09-12 DIAGNOSIS — R319 Hematuria, unspecified: Secondary | ICD-10-CM | POA: Diagnosis not present

## 2017-09-12 DIAGNOSIS — N390 Urinary tract infection, site not specified: Secondary | ICD-10-CM | POA: Diagnosis not present

## 2017-09-12 DIAGNOSIS — Z113 Encounter for screening for infections with a predominantly sexual mode of transmission: Secondary | ICD-10-CM

## 2017-09-12 LAB — POCT URINALYSIS DIPSTICK
Bilirubin, UA: NEGATIVE
Glucose, UA: NEGATIVE
Ketones, UA: NEGATIVE
LEUKOCYTES UA: NEGATIVE
NITRITE UA: NEGATIVE
Protein, UA: NEGATIVE
UROBILINOGEN UA: NEGATIVE U/dL — AB
pH, UA: 5 (ref 5.0–8.0)

## 2017-09-12 MED ORDER — NITROFURANTOIN MONOHYD MACRO 100 MG PO CAPS: ORAL_CAPSULE | ORAL | 0 refills | Status: DC

## 2017-09-12 NOTE — Progress Notes (Signed)
46 y.o. Single African American female [id number] here with complaint of UTI, with onset  on 3-4 days ago. Patient complaining of urine being dark and some low pain in bladder area. Patient denies fever, chills, nausea or back pain.Has noted increase in vaginal discharge,no itching or burning or odor. No new personal products except for soap. New partner, desires vaginal  STD screening only. Has noted headache increases and has been seen in ER for headache relief. Patient has tooth that has a problem and wonders if this may be the reason for her headaches, plans to see dentist soon. Contraception is BTL. Has not been drinking very much fluids, some water, no soda, and some tea. No other health concerns today. Last period was normal on 08-21-17.  ROS Pertinent to above  POCT urine: 1+ RBC  O: Healthy female WDWN Affect: Normal, orientation x 3 Skin : warm and dry CVAT: negative  bilateral Abdomen: positive  for suprapubic tenderness  Pelvic exam: External genital area: normal, no lesions Bladder,Urethra tender, Urethral meatus: tender, red Vagina: yellow slightly odorous vaginal discharge, normal appearance of vagina, no lesions  Cervix: normal, non tender Uterus:normal,non tender Adnexa: normal non tender, no fullness or masses   A: UTI Normal pelvic exam Vaginal STD screening Headaches with ER visits ? Tooth issue  P: Reviewed findings of normal pelvic exam . Discussed symptoms and exam consistent with  UTI and need for treatment. Questions addressed Rx: Macrobid see order with instructions AVW:UJWJXLab:Urine micro,  Reviewed warning signs and symptoms of UTI and need to advise if occurring. Encouraged to limit soda, tea, and coffee and be sure to increase water intake. Discussed STD prevention with condom use and same partner. Labs: Vaginal screen and GC/Chlamydia Encouraged to schedule dentist visit. Warning signs of headaches given.   RV prn

## 2017-09-12 NOTE — Patient Instructions (Signed)

## 2017-09-13 ENCOUNTER — Ambulatory Visit (INDEPENDENT_AMBULATORY_CARE_PROVIDER_SITE_OTHER): Payer: 59 | Admitting: Physical Therapy

## 2017-09-13 ENCOUNTER — Encounter: Payer: Self-pay | Admitting: Physical Therapy

## 2017-09-13 DIAGNOSIS — M545 Low back pain: Secondary | ICD-10-CM

## 2017-09-13 DIAGNOSIS — G8929 Other chronic pain: Secondary | ICD-10-CM

## 2017-09-13 DIAGNOSIS — M542 Cervicalgia: Secondary | ICD-10-CM | POA: Diagnosis not present

## 2017-09-13 DIAGNOSIS — R293 Abnormal posture: Secondary | ICD-10-CM

## 2017-09-13 LAB — URINALYSIS, MICROSCOPIC ONLY: Casts: NONE SEEN /lpf
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
## 2017-09-13 NOTE — Patient Instructions (Signed)
PELVIC STABILIZATION: Pelvic Tilt (Lying)    Exhaling, pull belly toward spine, tilting pelvis forward. Hold for 5 seconds.  Inhaling, release. Repeat _10__ times. Do _1-2__ times per day.  Knee Drop   Keep pelvis stable. Without rotating hips, slowly drop knee to side, pause, return to center, bring knee across midline toward opposite hip. Feel obliques engaging. Repeat for ___10_ times each leg.  Knee Fold    Lie on back, legs bent, arms by sides. Exhale, lifting knee to chest. Inhale, returning. Keep abdominals flat, navel to spine. Repeat __10__ times, alternating legs. Do __2__ sessions per day.  Heel Slide to Straight   Slide one leg down to straight. Return. Be sure pelvis does not rock forward, tilt, rotate, or tip to side. Do _10__ times. Restabilize pelvis. Repeat with other leg. Do __1-2_ sets, __2_ times per day.

## 2017-09-14 LAB — VAGINITIS/VAGINOSIS, DNA PROBE
Candida Species: NEGATIVE
GARDNERELLA VAGINALIS: NEGATIVE
TRICHOMONAS VAG: NEGATIVE

## 2017-09-14 LAB — GC/CHLAMYDIA PROBE AMP
Chlamydia trachomatis, NAA: NEGATIVE
Neisseria gonorrhoeae by PCR: NEGATIVE

## 2017-10-30 ENCOUNTER — Ambulatory Visit: Payer: 59 | Admitting: Family Medicine

## 2017-10-31 ENCOUNTER — Ambulatory Visit: Payer: 59 | Admitting: Family Medicine

## 2017-11-19 ENCOUNTER — Ambulatory Visit: Payer: 59 | Admitting: Family Medicine

## 2017-12-05 ENCOUNTER — Ambulatory Visit: Payer: Commercial Managed Care - HMO | Admitting: Obstetrics and Gynecology

## 2018-01-09 ENCOUNTER — Emergency Department (HOSPITAL_COMMUNITY): Payer: 59

## 2018-01-09 ENCOUNTER — Emergency Department (HOSPITAL_COMMUNITY)
Admission: EM | Admit: 2018-01-09 | Discharge: 2018-01-09 | Disposition: A | Discharge status: Home / Self Care | Payer: 59 | Attending: Emergency Medicine | Admitting: Emergency Medicine

## 2018-01-09 ENCOUNTER — Encounter (HOSPITAL_COMMUNITY): Payer: Self-pay | Admitting: Emergency Medicine

## 2018-01-09 DIAGNOSIS — I10 Essential (primary) hypertension: Secondary | ICD-10-CM | POA: Insufficient documentation

## 2018-01-09 DIAGNOSIS — R519 Headache, unspecified: Secondary | ICD-10-CM

## 2018-01-09 DIAGNOSIS — Z79899 Other long term (current) drug therapy: Secondary | ICD-10-CM | POA: Insufficient documentation

## 2018-01-09 DIAGNOSIS — R590 Localized enlarged lymph nodes: Secondary | ICD-10-CM

## 2018-01-09 DIAGNOSIS — N6320 Unspecified lump in the left breast, unspecified quadrant: Secondary | ICD-10-CM | POA: Diagnosis not present

## 2018-01-09 DIAGNOSIS — R51 Headache: Secondary | ICD-10-CM

## 2018-01-09 DIAGNOSIS — R300 Dysuria: Secondary | ICD-10-CM

## 2018-01-09 DIAGNOSIS — N63 Unspecified lump in unspecified breast: Secondary | ICD-10-CM

## 2018-01-09 LAB — URINALYSIS, ROUTINE W REFLEX MICROSCOPIC
Bilirubin Urine: NEGATIVE
Glucose, UA: 50 mg/dL — AB
KETONES UR: NEGATIVE mg/dL
Leukocytes, UA: NEGATIVE
Nitrite: NEGATIVE
PROTEIN: NEGATIVE mg/dL
Specific Gravity, Urine: 1.019 (ref 1.005–1.030)
pH: 6 (ref 5.0–8.0)

## 2018-01-09 LAB — CBC WITH DIFFERENTIAL/PLATELET
BASOS PCT: 0 %
Basophils Absolute: 0 10*3/uL (ref 0.0–0.1)
EOS ABS: 0.2 10*3/uL (ref 0.0–0.7)
EOS PCT: 3 %
HCT: 36.9 % (ref 36.0–46.0)
Hemoglobin: 11.7 g/dL — ABNORMAL LOW (ref 12.0–15.0)
LYMPHS ABS: 2.3 10*3/uL (ref 0.7–4.0)
Lymphocytes Relative: 41 %
MCH: 24.8 pg — AB (ref 26.0–34.0)
MCHC: 31.7 g/dL (ref 30.0–36.0)
MCV: 78.3 fL (ref 78.0–100.0)
MONOS PCT: 7 %
Monocytes Absolute: 0.4 10*3/uL (ref 0.1–1.0)
Neutro Abs: 2.9 10*3/uL (ref 1.7–7.7)
Neutrophils Relative %: 49 %
PLATELETS: 304 10*3/uL (ref 150–400)
RBC: 4.71 MIL/uL (ref 3.87–5.11)
RDW: 15.1 % (ref 11.5–15.5)
WBC: 5.8 10*3/uL (ref 4.0–10.5)

## 2018-01-09 LAB — BASIC METABOLIC PANEL
Anion gap: 10 (ref 5–15)
BUN: 12 mg/dL (ref 6–20)
CALCIUM: 8.7 mg/dL — AB (ref 8.9–10.3)
CO2: 24 mmol/L (ref 22–32)
CREATININE: 0.68 mg/dL (ref 0.44–1.00)
Chloride: 109 mmol/L (ref 101–111)
GFR calc Af Amer: >60 mL/min (ref >60)
GFR calc non Af Amer: >60 mL/min (ref >60)
Glucose, Bld: 117 mg/dL — ABNORMAL HIGH (ref 65–99)
Potassium: 3.6 mmol/L (ref 3.5–5.1)
SODIUM: 143 mmol/L (ref 135–145)

## 2018-01-09 LAB — I-STAT BETA HCG BLOOD, ED (MC, WL, AP ONLY)

## 2018-01-09 MED ORDER — CEPHALEXIN 500 MG PO CAPS: 500.0000 mg | ORAL_CAPSULE | Freq: Four times a day (QID) | ORAL | 0 refills | Status: DC

## 2018-01-09 MED ORDER — LISINOPRIL 10 MG PO TABS: 10.0000 mg | ORAL_TABLET | Freq: Every day | ORAL | 1 refills | Status: AC

## 2018-01-09 NOTE — Discharge Instructions (Addendum)
Keflex as prescribed.  Ibuprofen 600 mg every 6 hours as needed for pain.  Follow-up with your primary doctor if symptoms are not improving in the next 3-4 days.  Keep your appointment for your mammogram.

## 2018-01-09 NOTE — ED Triage Notes (Signed)
Patient here from home with complaints of headache, painful urination, left breast pain, and throat pain. States that he has lower back pain and may have a UTI. Reports headache upon wakening today. Also states that there is a growth on her left breast.
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
## 2018-01-09 NOTE — ED Notes (Addendum)
Patient has multiple complaints. Lower back pain- 4 days Knot under chin and on her left breast- 1 week Neck pain & headache- 8 months

## 2018-01-22 ENCOUNTER — Telehealth: Payer: Self-pay | Admitting: Obstetrics and Gynecology

## 2018-01-22 NOTE — Telephone Encounter (Signed)
Patient called and stated that she is having lower back pain.

## 2018-01-22 NOTE — Telephone Encounter (Signed)
Spoke with patient. Reports lower back pain, pelvic pain, urinary frequency and intermittent nausea for the past 4-5 days. Patient states she was seen in ER on 4/3, was treated with Keflex, symptoms have not resolved.   Denies burning with urination, blood in urine, vagina d/c, odor, vomiting, fever/chills.   Patient states her B/P was elevated in ER and she also has a knot under her chin. Instructed patient to f/u with her PCP for further evaluation of knot under chin and B/P. Patient states she does not want to return to current PCP, has "placed a call for a new provider, awaiting return call for scheduling".   OV scheduled for 4/18 at 8:15am with Dr. Oscar LaJertson for evaluation of UTI symptoms, patient declined earlier appt. Advised patient should new symptoms develop or symptoms worsen, return call to office, if after hours seek immediate care at local ER/Urgent care.  Routing to provider for final review. Patient is agreeable to disposition. Will close encounter.

## 2018-01-24 ENCOUNTER — Telehealth: Payer: Self-pay | Admitting: *Deleted

## 2018-01-24 ENCOUNTER — Encounter: Payer: Self-pay | Admitting: Obstetrics and Gynecology

## 2018-01-24 ENCOUNTER — Other Ambulatory Visit: Payer: Self-pay

## 2018-01-24 ENCOUNTER — Ambulatory Visit: Payer: 59 | Admitting: Obstetrics and Gynecology

## 2018-01-24 VITALS — BP 126/84 | HR 72 | Temp 98.2°F | Resp 16 | Wt 193.0 lb

## 2018-01-24 DIAGNOSIS — N730 Acute parametritis and pelvic cellulitis: Secondary | ICD-10-CM

## 2018-01-24 DIAGNOSIS — R102 Pelvic and perineal pain: Secondary | ICD-10-CM | POA: Diagnosis not present

## 2018-01-24 DIAGNOSIS — N920 Excessive and frequent menstruation with regular cycle: Secondary | ICD-10-CM

## 2018-01-24 DIAGNOSIS — R350 Frequency of micturition: Secondary | ICD-10-CM

## 2018-01-24 DIAGNOSIS — R3915 Urgency of urination: Secondary | ICD-10-CM | POA: Diagnosis not present

## 2018-01-24 DIAGNOSIS — Z113 Encounter for screening for infections with a predominantly sexual mode of transmission: Secondary | ICD-10-CM

## 2018-01-24 DIAGNOSIS — N898 Other specified noninflammatory disorders of vagina: Secondary | ICD-10-CM

## 2018-01-24 LAB — CBC WITH DIFFERENTIAL/PLATELET
BASOS: 0 %
Basophils Absolute: 0 10*3/uL (ref 0.0–0.2)
EOS (ABSOLUTE): 0.1 10*3/uL (ref 0.0–0.4)
EOS: 3 %
HEMOGLOBIN: 13.2 g/dL (ref 11.1–15.9)
Hematocrit: 38.7 % (ref 34.0–46.6)
LYMPHS ABS: 1.6 10*3/uL (ref 0.7–3.1)
Lymphs: 35 %
MCH: 25.2 pg — AB (ref 26.6–33.0)
MCHC: 34.1 g/dL (ref 31.5–35.7)
MCV: 74 fL — ABNORMAL LOW (ref 79–97)
MONOS ABS: 0.4 10*3/uL (ref 0.1–0.9)
Monocytes: 9 %
NEUTROS PCT: 53 %
Neutrophils Absolute: 2.4 10*3/uL (ref 1.4–7.0)
PLATELETS: 278 10*3/uL (ref 150–379)
RBC: 5.24 x10E6/uL (ref 3.77–5.28)
RDW: 17 % — AB (ref 12.3–15.4)
WBC: 4.5 10*3/uL (ref 3.4–10.8)

## 2018-01-24 LAB — POCT URINALYSIS DIPSTICK
BILIRUBIN UA: NEGATIVE
GLUCOSE UA: NEGATIVE
Ketones, UA: NEGATIVE
LEUKOCYTES UA: NEGATIVE
Nitrite, UA: NEGATIVE
Protein, UA: NEGATIVE
pH, UA: 5 (ref 5.0–8.0)

## 2018-01-24 LAB — POCT URINE PREGNANCY: Preg Test, Ur: NEGATIVE

## 2018-01-24 MED ORDER — PROMETHAZINE HCL 12.5 MG PO TABS: 12.5000 mg | ORAL_TABLET | ORAL | 0 refills | Status: DC | PRN

## 2018-01-24 MED ORDER — CEFTRIAXONE SODIUM 250 MG IJ SOLR
250.0000 mg | Freq: Once | INTRAMUSCULAR | Status: AC
  Administered 2018-01-24: 250 mg via INTRAMUSCULAR

## 2018-01-24 MED ORDER — DOXYCYCLINE HYCLATE 100 MG PO CAPS: 100.0000 mg | ORAL_CAPSULE | Freq: Two times a day (BID) | ORAL | 0 refills | Status: DC

## 2018-01-24 NOTE — Patient Instructions (Signed)
You need to go to the ER if your pain gets worse, if you get a fever, or if you can't tolerate your medicine   Pelvic Inflammatory Disease Pelvic inflammatory disease (PID) refers to an infection in some or all of the female organs. The infection can be in the uterus, ovaries, fallopian tubes, or the surrounding tissues in the pelvis. PID can cause abdominal or pelvic pain that comes on suddenly (acute pelvic pain). PID is a serious infection because it can lead to lasting (chronic) pelvic pain or the inability to have children (infertility). What are the causes? This condition is most often caused by an infection that is spread during sexual contact. However, the infection can also be caused by the normal bacteria that are found in the vaginal tissues if these bacteria travel upward into the reproductive organs. PID can also occur following:  The birth of a baby.  A miscarriage.  An abortion.  Major pelvic surgery.  The use of an intrauterine device (IUD).  A sexual assault.  What increases the risk? This condition is more likely to develop in women who:  Are younger than 45 years of age.  [facility].  Use nonbarrier contraception.  Have multiple sexual partners.  Have sex with someone who has symptoms of an STD (sexually transmitted disease).  Use oral contraception.  At times, certain behaviors can also increase the possibility of getting PID, such as:  Using a vaginal douche.  Having an IUD in place.  What are the signs or symptoms? Symptoms of this condition include:  Abdominal or pelvic pain.  Fever.  Chills.  Abnormal vaginal discharge.  Abnormal uterine bleeding.  Unusual pain shortly after the end of a menstrual period.  Painful urination.  Pain with sexual intercourse.  Nausea and vomiting.  How is this diagnosed? To diagnose this condition, your health care provider will do a physical exam and take your medical history.  A pelvic exam typically reveals great tenderness in the uterus and the surrounding pelvic tissues. You may also have tests, such as:  Lab tests, including a pregnancy test, blood tests, and urine test.  Culture tests of the vagina and cervix to check for an STD.  Ultrasound.  A laparoscopic procedure to look inside the pelvis.  Examining vaginal secretions under a microscope.  How is this treated? Treatment for this condition may involve one or more approaches.  Antibiotic medicines may be prescribed to be taken by mouth.  Sexual partners may need to be treated if the infection is caused by an STD.  For more severe cases, hospitalization may be needed to give antibiotics directly into a vein through an IV tube.  Surgery may be needed if other treatments do not help, but this is rare.  It may take weeks until you are completely well. If you are diagnosed with PID, you should also be checked for human immunodeficiency virus (HIV). Your health care provider may test you for infection again 3 months after treatment. You should not have unprotected sex. Follow these instructions at home:  Take over-the-counter and prescription medicines only as told by your health care provider.  If you were prescribed an antibiotic medicine, take it as told by your health care provider. Do not stop taking the antibiotic even if you start to feel better.  Do not have sexual intercourse until treatment is completed or as told by your health care provider. If PID is confirmed, your recent sexual partners will need  treatment, especially if you had unprotected sex.  Keep all follow-up visits as told by your health care provider. This is important. Contact a health care provider if:  You have increased or abnormal vaginal discharge.  Your pain does not improve.  You vomit.  You have a fever.  You cannot tolerate your medicines.  Your partner has an STD.  You have pain when you urinate. Get help  right away if:  You have increased abdominal or pelvic pain.  You have chills.  Your symptoms are not better in 72 hours even with treatment. This information is not intended to replace advice given to you by your health care provider. Make sure you discuss any questions you have with your health care provider. Document Released: 09/25/2005 Document Revised: 03/02/2016 Document Reviewed: 11/02/2014 Elsevier Interactive Patient Education  [employer].

## 2018-01-24 NOTE — Telephone Encounter (Signed)
Left message with normal results. ( advised to call on call doctor if she needs anything over the weekend or pain gets worse -eh
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
## 2018-01-25 ENCOUNTER — Other Ambulatory Visit: Payer: Self-pay | Admitting: Obstetrics and Gynecology

## 2018-01-25 LAB — URINALYSIS, MICROSCOPIC ONLY
BACTERIA UA: NONE SEEN
CASTS: NONE SEEN /LPF

## 2018-01-25 LAB — HEPATITIS C ANTIBODY

## 2018-01-25 LAB — TSH: TSH: 1.62 u[IU]/mL (ref 0.450–4.500)

## 2018-01-25 LAB — VAGINITIS/VAGINOSIS, DNA PROBE
CANDIDA SPECIES: NEGATIVE
GARDNERELLA VAGINALIS: POSITIVE — AB
TRICHOMONAS VAG: NEGATIVE

## 2018-01-25 LAB — URINE CULTURE

## 2018-01-25 LAB — GC/CHLAMYDIA PROBE AMP
Chlamydia trachomatis, NAA: NEGATIVE
NEISSERIA GONORRHOEAE BY PCR: NEGATIVE

## 2018-01-25 LAB — HEP, RPR, HIV PANEL
HIV SCREEN 4TH GENERATION: NONREACTIVE
Hepatitis B Surface Ag: NEGATIVE
RPR: NONREACTIVE

## 2018-01-25 MED ORDER — CLINDAMYCIN HCL 300 MG PO CAPS: 300.0000 mg | ORAL_CAPSULE | Freq: Four times a day (QID) | ORAL | 0 refills | Status: DC

## 2018-01-25 MED ORDER — CLINDAMYCIN HCL 150 MG PO CAPS: 150.0000 mg | ORAL_CAPSULE | Freq: Four times a day (QID) | ORAL | 0 refills | Status: DC

## 2018-01-28 ENCOUNTER — Ambulatory Visit (INDEPENDENT_AMBULATORY_CARE_PROVIDER_SITE_OTHER): Payer: 59 | Admitting: Obstetrics and Gynecology

## 2018-01-28 ENCOUNTER — Other Ambulatory Visit: Payer: Self-pay

## 2018-01-28 ENCOUNTER — Telehealth: Payer: Self-pay | Admitting: Obstetrics and Gynecology

## 2018-01-28 ENCOUNTER — Encounter: Payer: Self-pay | Admitting: Obstetrics and Gynecology

## 2018-01-28 ENCOUNTER — Telehealth: Payer: Self-pay

## 2018-01-28 VITALS — BP 136/90 | HR 90 | Resp 16 | Ht 63.0 in | Wt 193.0 lb

## 2018-01-28 DIAGNOSIS — N730 Acute parametritis and pelvic cellulitis: Secondary | ICD-10-CM | POA: Diagnosis not present

## 2018-01-28 DIAGNOSIS — R3915 Urgency of urination: Secondary | ICD-10-CM

## 2018-01-28 DIAGNOSIS — R102 Pelvic and perineal pain: Secondary | ICD-10-CM | POA: Diagnosis not present

## 2018-01-28 DIAGNOSIS — N644 Mastodynia: Secondary | ICD-10-CM | POA: Diagnosis not present

## 2018-01-28 NOTE — Telephone Encounter (Signed)
If her pain resolves, then I think it is reasonable to defer the ultrasound. She is supposed to have a f/u appointment next week. I can discuss it further with her then.   CC: Breanna Daniels

## 2018-01-28 NOTE — Telephone Encounter (Signed)
Patient notified of negative urine culture.

## 2018-01-28 NOTE — Telephone Encounter (Signed)
Called patient and left message for her to return my call. 
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
## 2018-01-28 NOTE — Telephone Encounter (Signed)
Spoke with patient regarding benefit for recommended ultrasound. Patient understood information presented. Patient request to defer scheduling to July. Advised patient I will forward to her provider to review and advise. Patient understood and is agreeable to a return call.    Routing to Dr Oscar LaJertson  cc: Billie RuddySally Yeakley, RN

## 2018-01-28 NOTE — Telephone Encounter (Signed)
Patient returned call and stated she will get the results at her visit this afternoon.

## 2018-01-28 NOTE — Telephone Encounter (Signed)
-----   Message from Romualdo BolkJill Evelyn Jertson, MD sent at 01/27/2018  9:46 AM EDT ----- Please advise the patient of normal results.

## 2018-02-07 ENCOUNTER — Encounter: Payer: Self-pay | Admitting: Obstetrics and Gynecology

## 2018-02-07 ENCOUNTER — Ambulatory Visit: Payer: 59 | Admitting: Obstetrics and Gynecology

## 2018-02-07 ENCOUNTER — Other Ambulatory Visit: Payer: Self-pay

## 2018-02-07 VITALS — BP 138/96 | HR 72 | Resp 16 | Wt 194.0 lb

## 2018-02-07 DIAGNOSIS — N920 Excessive and frequent menstruation with regular cycle: Secondary | ICD-10-CM

## 2018-02-07 DIAGNOSIS — N946 Dysmenorrhea, unspecified: Secondary | ICD-10-CM | POA: Diagnosis not present

## 2018-02-07 DIAGNOSIS — N644 Mastodynia: Secondary | ICD-10-CM | POA: Diagnosis not present

## 2018-02-07 DIAGNOSIS — Z8742 Personal history of other diseases of the female genital tract: Secondary | ICD-10-CM | POA: Diagnosis not present

## 2018-02-07 NOTE — Patient Instructions (Signed)
To try and decrease your breast pain, you should have a well fitting supportive bra, cut back on caffeine, and use ice or heat as needed. Some women find relief with the supplement evening primrose oil.  

## 2018-02-07 NOTE — Progress Notes (Signed)
Scheduled patient while in office for bilateral diagnostic mammogram and [facility]14[facility]2018 at 11 am. Patient is agreeable to date and time. Placed in mammogram hold.

## 2018-02-07 NOTE — Telephone Encounter (Signed)
See office note from 02-07-18. Encounter closed.
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
## 2018-02-14 ENCOUNTER — Ambulatory Visit: Admission: RE | Admit: 2018-02-14 | Payer: 59 | Source: Ambulatory Visit

## 2018-02-14 ENCOUNTER — Ambulatory Visit
Admission: RE | Admit: 2018-02-14 | Discharge: 2018-02-14 | Disposition: A | Discharge status: Home / Self Care | Payer: 59 | Source: Ambulatory Visit | Attending: Obstetrics and Gynecology | Admitting: Obstetrics and Gynecology

## 2018-02-14 DIAGNOSIS — N644 Mastodynia: Secondary | ICD-10-CM

## 2018-02-28 ENCOUNTER — Encounter (HOSPITAL_COMMUNITY): Payer: Self-pay | Admitting: Emergency Medicine

## 2018-02-28 ENCOUNTER — Emergency Department (HOSPITAL_COMMUNITY)
Admission: EM | Admit: 2018-02-28 | Discharge: 2018-02-28 | Disposition: A | Discharge status: Home / Self Care | Payer: 59 | Attending: Emergency Medicine | Admitting: Emergency Medicine

## 2018-02-28 ENCOUNTER — Emergency Department (HOSPITAL_COMMUNITY): Payer: 59

## 2018-02-28 DIAGNOSIS — R51 Headache: Secondary | ICD-10-CM | POA: Diagnosis not present

## 2018-02-28 DIAGNOSIS — I10 Essential (primary) hypertension: Secondary | ICD-10-CM | POA: Insufficient documentation

## 2018-02-28 DIAGNOSIS — M542 Cervicalgia: Secondary | ICD-10-CM | POA: Diagnosis not present

## 2018-02-28 DIAGNOSIS — R519 Headache, unspecified: Secondary | ICD-10-CM

## 2018-02-28 DIAGNOSIS — Z79899 Other long term (current) drug therapy: Secondary | ICD-10-CM | POA: Insufficient documentation

## 2018-02-28 LAB — POC URINE PREG, ED: Preg Test, Ur: NEGATIVE

## 2018-02-28 MED ORDER — KETOROLAC TROMETHAMINE 15 MG/ML IJ SOLN
15.0000 mg | Freq: Once | INTRAMUSCULAR | Status: AC
  Administered 2018-02-28: 15 mg via INTRAMUSCULAR

## 2018-02-28 MED ORDER — PROCHLORPERAZINE EDISYLATE 10 MG/2ML IJ SOLN
10.0000 mg | Freq: Once | INTRAMUSCULAR | Status: AC
  Administered 2018-02-28: 10 mg via INTRAMUSCULAR

## 2018-02-28 MED ORDER — KETOROLAC TROMETHAMINE 15 MG/ML IJ SOLN
15.0000 mg | Freq: Once | INTRAMUSCULAR | Status: DC
  Filled 2018-02-28: qty 1

## 2018-02-28 MED ORDER — PROCHLORPERAZINE EDISYLATE 10 MG/2ML IJ SOLN
10.0000 mg | Freq: Once | INTRAMUSCULAR | Status: DC
  Filled 2018-02-28: qty 2

## 2018-02-28 MED ORDER — SODIUM CHLORIDE 0.9 % IV BOLUS: 1000.0000 mL | Freq: Once | INTRAVENOUS | Status: DC

## 2018-02-28 MED ORDER — NAPROXEN 500 MG PO TABS: 500.0000 mg | ORAL_TABLET | Freq: Two times a day (BID) | ORAL | 0 refills | Status: AC

## 2018-02-28 MED ORDER — SODIUM CHLORIDE 0.9 % IV SOLN: INTRAVENOUS | Status: DC

## 2018-02-28 MED ORDER — METHOCARBAMOL 500 MG PO TABS: 500.0000 mg | ORAL_TABLET | Freq: Three times a day (TID) | ORAL | 0 refills | Status: AC | PRN

## 2018-02-28 NOTE — Discharge Instructions (Signed)
You were seen in the emergency department for a headache.  Your CT scan was normal.  We suspect this is a migraine, a tension headache, or a headache related to spasms in muscles in your neck.  We are sending you home with 2 medications to treat your headaches. Naproxen is a nonsteroidal anti-inflammatory medication that will help with pain and swelling. Be sure to take this medication as prescribed with food, 1 pill every 12 hours,  It should be taken with food, as it can cause stomach upset, and more seriously, stomach bleeding. Do not take other nonsteroidal anti-inflammatory medications with this such as Advil, Motrin, or Aleve.   Robaxin is the muscle relaxer I have prescribed, this is meant to help with muscle tightness. Be aware that this medication may make you drowsy therefore the first time you take this it should be at a time you are in an environment where you can rest. Do not drive or operate heavy machinery when taking this medication.     You may also take Tylenol safely with these medicines per over-the-counter dosing instructions. We have prescribed you new medication(s) today. Discuss the medications prescribed today with your pharmacist as they can have adverse effects and interactions with your other medicines including over the counter and prescribed medications. Seek medical evaluation if you start to experience new or abnormal symptoms after taking one of these medicines, seek care immediately if you start to experience difficulty breathing, feeling of your throat closing, facial swelling, or rash as these could be indications of a more serious allergic reaction   Follow-up with your primary care provider in 3 days for reevaluation and potential referral to a neurologist.  Return to the ER for new or worsening symptoms or any other concerns.
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
## 2018-02-28 NOTE — ED Triage Notes (Signed)
Pt reports that she had headache and neck pains for over weeks.

## 2018-03-07 ENCOUNTER — Ambulatory Visit: Payer: 59 | Admitting: Obstetrics and Gynecology

## 2018-03-28 ENCOUNTER — Ambulatory Visit: Payer: 59 | Admitting: Obstetrics and Gynecology

## 2019-07-28 IMAGING — CT CT HEAD W/O CM
3 series · 16 of 47 positions shown, 19 images · non-contrast
Comparison: 10/20/2016

CLINICAL DATA: Patient woke up this morning with headache. Normal
neurological examination. History of hypertension.

EXAM:
CT HEAD WITHOUT CONTRAST
TECHNIQUE: Contiguous axial images were obtained from the base of the skull
through the vertex without intravenous contrast.

[Series 2: head wo · axial · 0.47mm/px · z∈[+1400,+1525]mm · 10 of 31 slices shown, 13 images]
[im 3/31  brain]
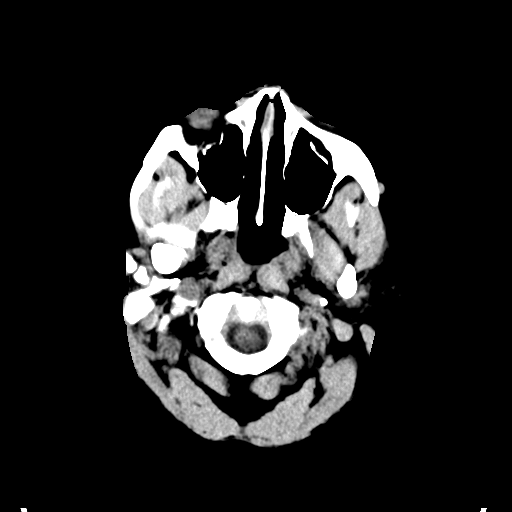
[im 3/31  bone]
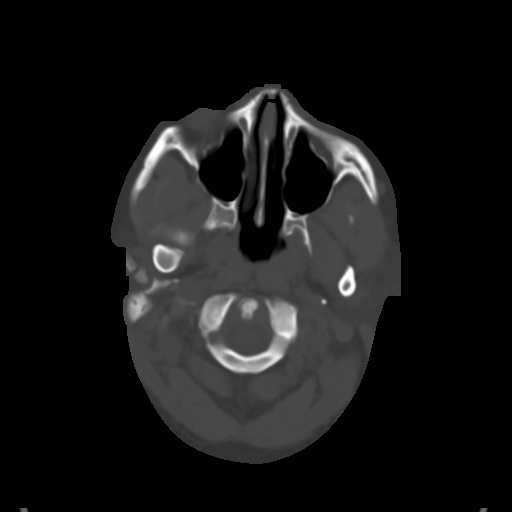
[im 6/31  brain]
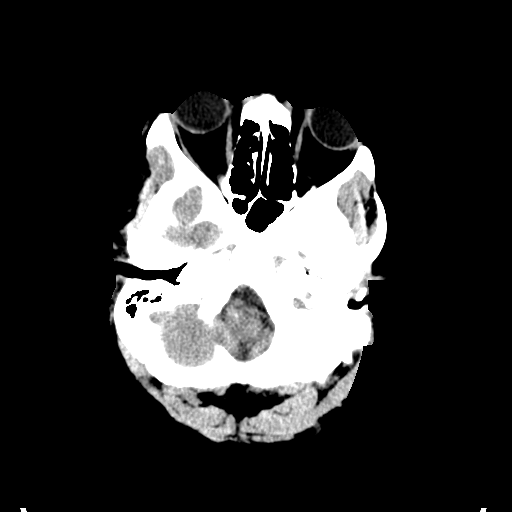
[im 9/31  brain]
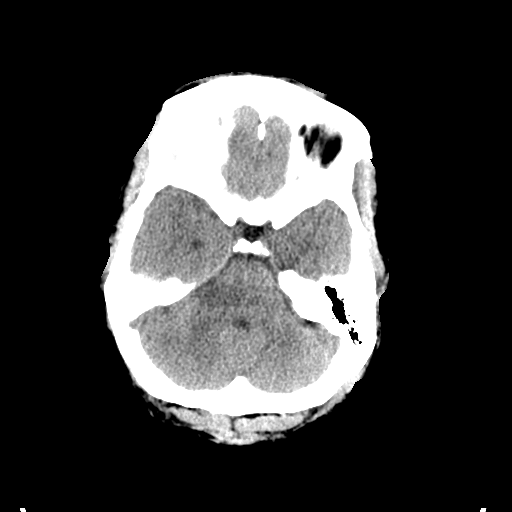
[im 11/31  brain]
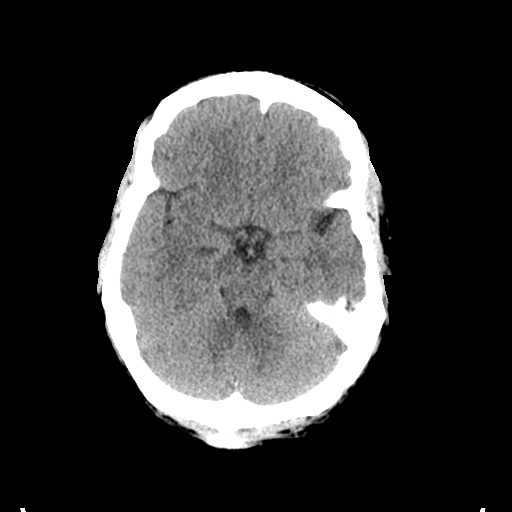
[im 14/31  brain]
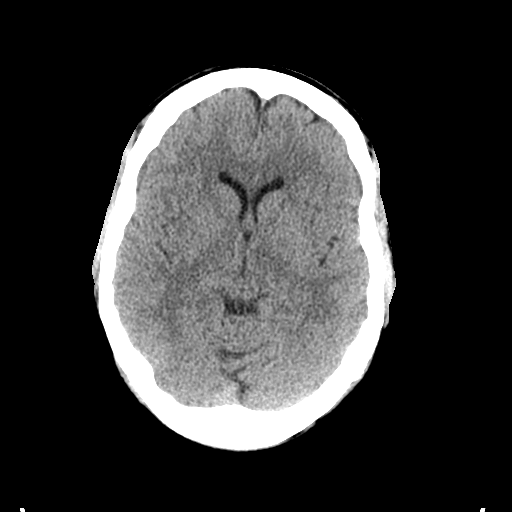
[im 14/31  bone]
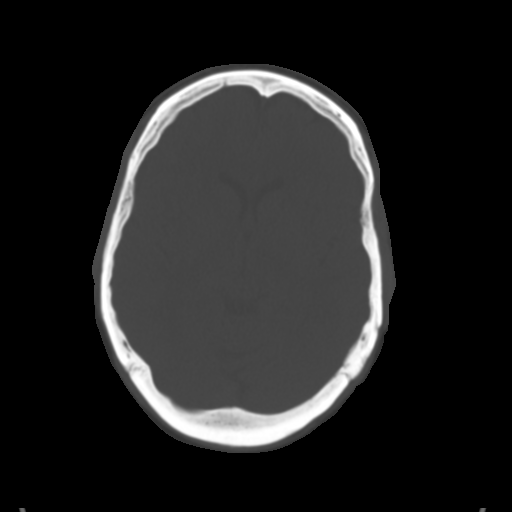
[im 17/31  brain]
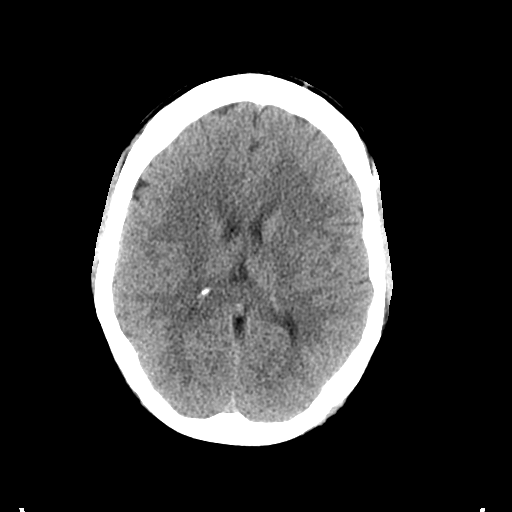
[im 20/31  brain]
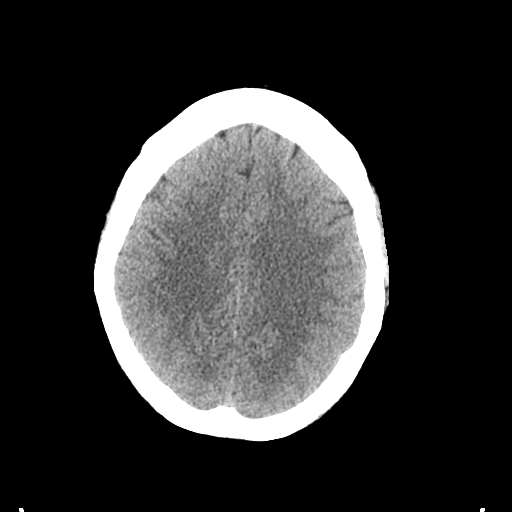
[im 23/31  brain]
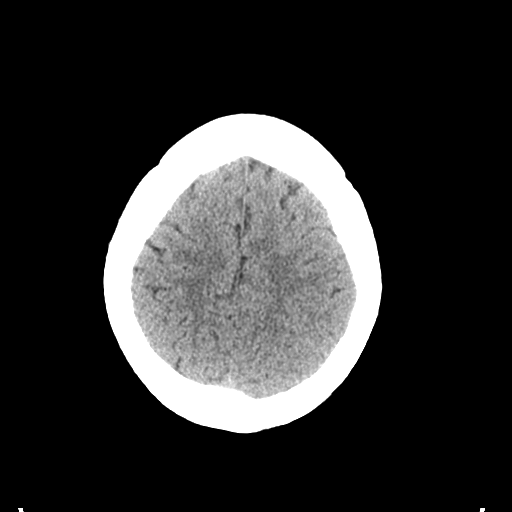
[im 25/31  brain]
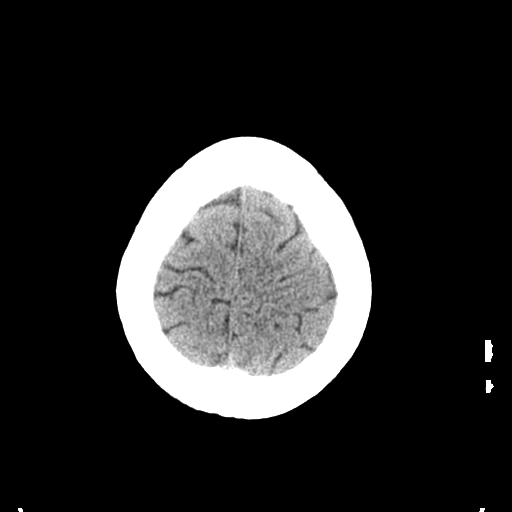
[im 25/31  bone]
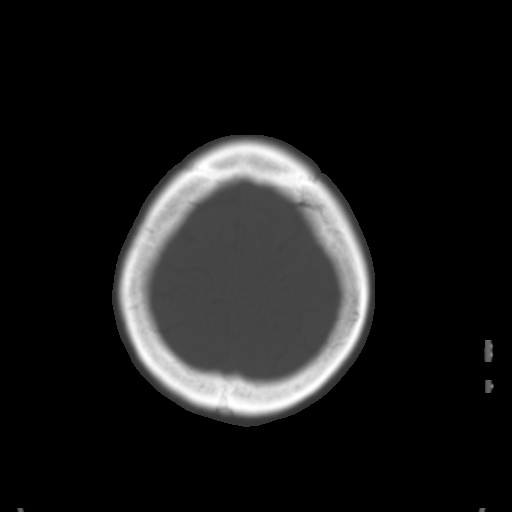
[im 28/31  brain]
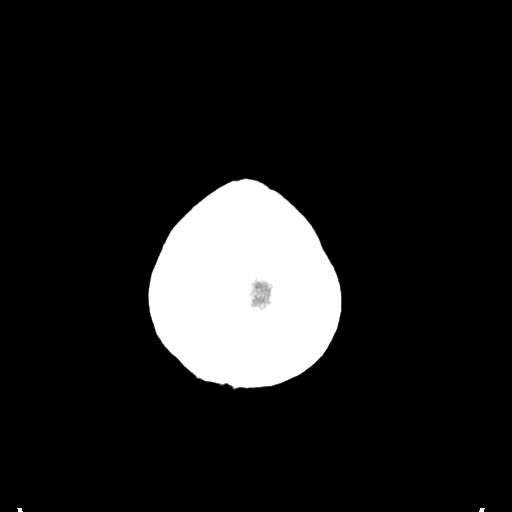

[Series 4: coronal soft tissue · coronal · 0.28mm/px · 3 of 60 slices shown]
[im 20/60  brain]
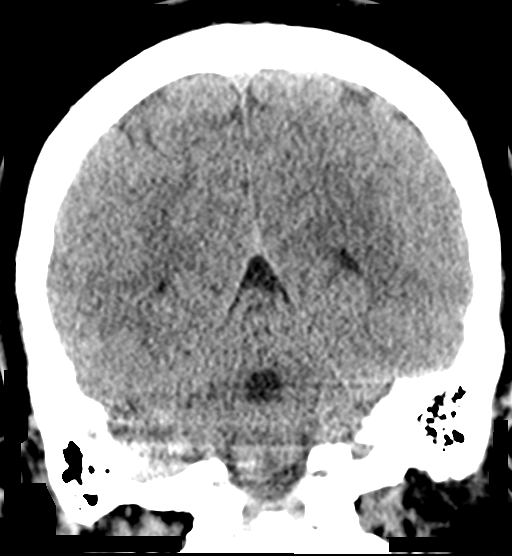
[im 27/60  brain]
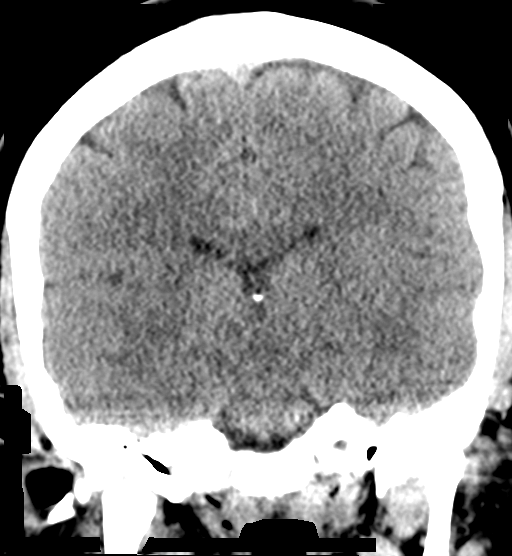
[im 33/60  brain]
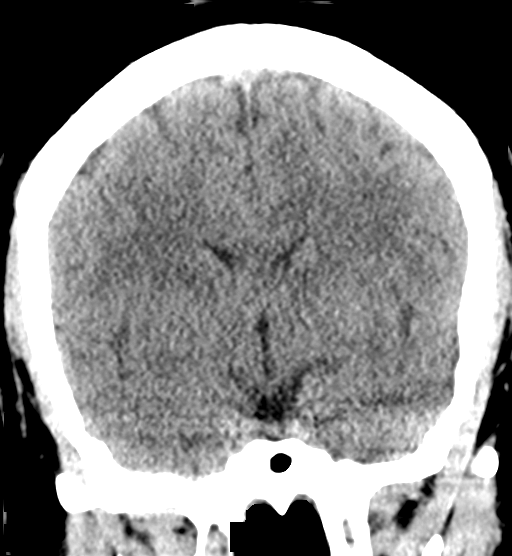

[Series 5: sagittal soft tissue · sagittal · 0.30mm/px · 3 of 47 slices shown]
[im 16/47  brain]
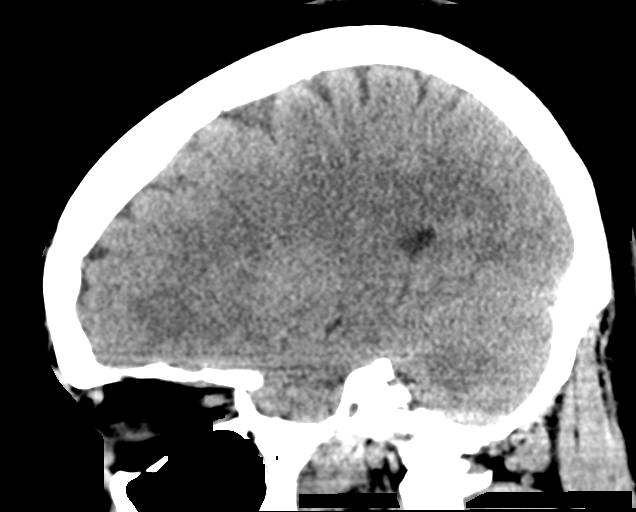
[im 24/47  brain]
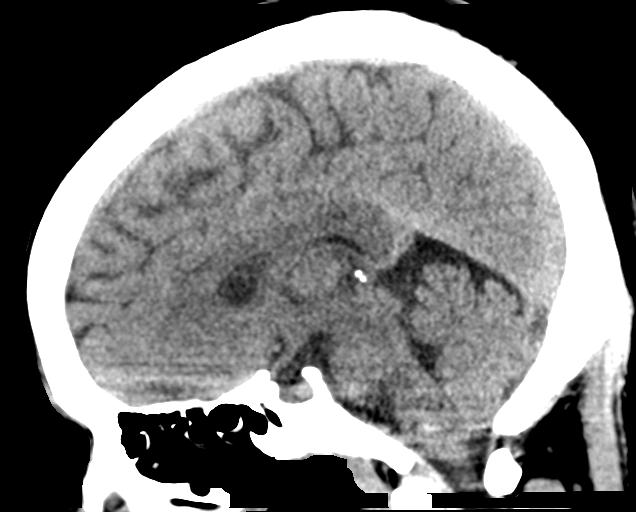
[im 31/47  brain]
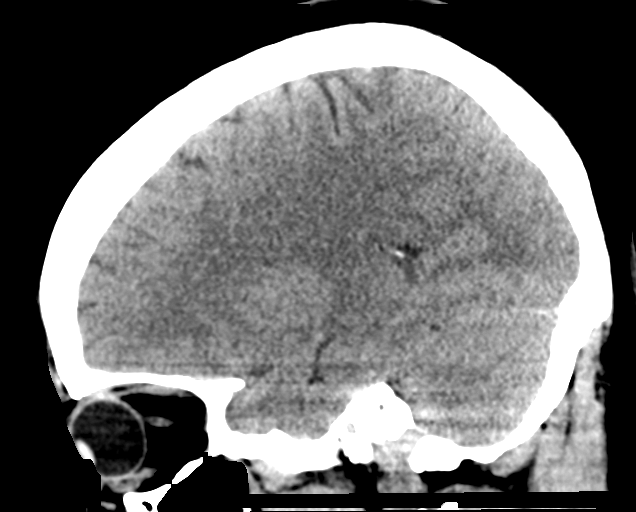

[16 of 47 positions shown; findings below may reference images not displayed]

FINDINGS: Brain: No evidence of acute infarction, hemorrhage, hydrocephalus,
extra-axial collection or mass lesion/mass effect.

Vascular: No hyperdense vessel or unexpected calcification.

Skull: Normal. Negative for fracture or focal lesion.

Sinuses/Orbits: No acute finding.

Other: None.
IMPRESSION: No acute intracranial abnormalities.

## 2019-12-26 ENCOUNTER — Encounter: Payer: Self-pay | Admitting: Certified Nurse Midwife
